# Patient Record
Sex: Female | Born: 1990 | Race: White | Hispanic: No | Marital: Married | Smoking: Never smoker
Health system: Southern US, Community
[De-identification: ages and names within clinical notes are randomized; demographics above are authoritative.]

## PROBLEM LIST (undated history)

## (undated) DIAGNOSIS — K219 Gastro-esophageal reflux disease without esophagitis: Secondary | ICD-10-CM

## (undated) DIAGNOSIS — O09299 Supervision of pregnancy with other poor reproductive or obstetric history, unspecified trimester: Secondary | ICD-10-CM

## (undated) DIAGNOSIS — I10 Essential (primary) hypertension: Secondary | ICD-10-CM

## (undated) DIAGNOSIS — E7800 Pure hypercholesterolemia, unspecified: Secondary | ICD-10-CM

## (undated) HISTORY — DX: Pure hypercholesterolemia, unspecified: E78.00

## (undated) HISTORY — DX: Essential (primary) hypertension: I10

## (undated) HISTORY — DX: Gastro-esophageal reflux disease without esophagitis: K21.9

## (undated) HISTORY — PX: NO PAST SURGERIES: SHX2092

---

## 2016-12-12 DIAGNOSIS — E66812 Obesity, class 2: Secondary | ICD-10-CM | POA: Insufficient documentation

## 2016-12-12 DIAGNOSIS — Z8349 Family history of other endocrine, nutritional and metabolic diseases: Secondary | ICD-10-CM | POA: Insufficient documentation

## 2016-12-12 DIAGNOSIS — E669 Obesity, unspecified: Secondary | ICD-10-CM | POA: Insufficient documentation

## 2017-03-06 DIAGNOSIS — E7800 Pure hypercholesterolemia, unspecified: Secondary | ICD-10-CM | POA: Insufficient documentation

## 2017-03-06 HISTORY — DX: Pure hypercholesterolemia, unspecified: E78.00

## 2017-03-31 DIAGNOSIS — Z8639 Personal history of other endocrine, nutritional and metabolic disease: Secondary | ICD-10-CM | POA: Insufficient documentation

## 2017-07-07 DIAGNOSIS — M79671 Pain in right foot: Secondary | ICD-10-CM | POA: Diagnosis not present

## 2017-07-17 DIAGNOSIS — Z130 Encounter for screening for diseases of the blood and blood-forming organs and certain disorders involving the immune mechanism: Secondary | ICD-10-CM | POA: Diagnosis not present

## 2017-07-17 DIAGNOSIS — Z124 Encounter for screening for malignant neoplasm of cervix: Secondary | ICD-10-CM | POA: Diagnosis not present

## 2017-07-17 DIAGNOSIS — Z01419 Encounter for gynecological examination (general) (routine) without abnormal findings: Secondary | ICD-10-CM | POA: Diagnosis not present

## 2017-07-17 DIAGNOSIS — E559 Vitamin D deficiency, unspecified: Secondary | ICD-10-CM | POA: Diagnosis not present

## 2017-07-17 DIAGNOSIS — Z6836 Body mass index (BMI) 36.0-36.9, adult: Secondary | ICD-10-CM | POA: Diagnosis not present

## 2017-07-17 LAB — VITAMIN D 25 HYDROXY (VIT D DEFICIENCY, FRACTURES): VIT D 25 HYDROXY: 21

## 2017-07-17 LAB — CBC AND DIFFERENTIAL
HEMATOCRIT: 39 (ref 36–46)
Hemoglobin: 13 (ref 12.0–16.0)
PLATELETS: 308 (ref 150–399)
WBC: 9.7

## 2017-07-17 LAB — HM PAP SMEAR

## 2017-08-10 DIAGNOSIS — S30861A Insect bite (nonvenomous) of abdominal wall, initial encounter: Secondary | ICD-10-CM | POA: Diagnosis not present

## 2017-09-04 ENCOUNTER — Other Ambulatory Visit: Payer: Self-pay

## 2017-09-04 ENCOUNTER — Ambulatory Visit: Payer: BLUE CROSS/BLUE SHIELD | Admitting: Physician Assistant

## 2017-09-04 ENCOUNTER — Encounter: Payer: Self-pay | Admitting: Physician Assistant

## 2017-09-04 VITALS — BP 120/84 | HR 92 | Temp 98.7°F | Ht 65.0 in | Wt 216.4 lb

## 2017-09-04 DIAGNOSIS — E7800 Pure hypercholesterolemia, unspecified: Secondary | ICD-10-CM

## 2017-09-04 DIAGNOSIS — E669 Obesity, unspecified: Secondary | ICD-10-CM

## 2017-09-04 DIAGNOSIS — Z7689 Persons encountering health services in other specified circumstances: Secondary | ICD-10-CM

## 2017-09-04 DIAGNOSIS — R635 Abnormal weight gain: Secondary | ICD-10-CM | POA: Diagnosis not present

## 2017-09-04 DIAGNOSIS — K219 Gastro-esophageal reflux disease without esophagitis: Secondary | ICD-10-CM | POA: Diagnosis not present

## 2017-09-04 LAB — CBC
HEMATOCRIT: 42.1 % (ref 36.0–46.0)
HEMOGLOBIN: 13.7 g/dL (ref 12.0–15.0)
MCHC: 32.6 g/dL (ref 30.0–36.0)
MCV: 86.4 fl (ref 78.0–100.0)
Platelets: 261 10*3/uL (ref 150.0–400.0)
RBC: 4.87 Mil/uL (ref 3.87–5.11)
RDW: 14.2 % (ref 11.5–15.5)
WBC: 7.1 10*3/uL (ref 4.0–10.5)

## 2017-09-04 LAB — COMPREHENSIVE METABOLIC PANEL
ALT: 19 U/L (ref 0–35)
AST: 18 U/L (ref 0–37)
Albumin: 3.9 g/dL (ref 3.5–5.2)
Alkaline Phosphatase: 47 U/L (ref 39–117)
BUN: 15 mg/dL (ref 6–23)
CHLORIDE: 103 meq/L (ref 96–112)
CO2: 30 mEq/L (ref 19–32)
Calcium: 9.5 mg/dL (ref 8.4–10.5)
Creatinine, Ser: 0.75 mg/dL (ref 0.40–1.20)
GFR: 99.05 mL/min (ref >60.00)
GLUCOSE: 93 mg/dL (ref 70–99)
POTASSIUM: 4.4 meq/L (ref 3.5–5.1)
SODIUM: 141 meq/L (ref 135–145)
Total Bilirubin: 0.4 mg/dL (ref 0.2–1.2)
Total Protein: 7.1 g/dL (ref 6.0–8.3)

## 2017-09-04 LAB — LIPID PANEL
CHOL/HDL RATIO: 3
Cholesterol: 297 mg/dL — ABNORMAL HIGH (ref 0–200)
HDL: 93.6 mg/dL (ref >39.00)
LDL Cholesterol: 187 mg/dL — ABNORMAL HIGH (ref 0–99)
NONHDL: 203.04
Triglycerides: 82 mg/dL (ref 0.0–149.0)
VLDL: 16.4 mg/dL (ref 0.0–40.0)

## 2017-09-04 LAB — TSH: TSH: 3.18 u[IU]/mL (ref 0.35–4.50)

## 2017-09-04 LAB — HEMOGLOBIN A1C: HEMOGLOBIN A1C: 5.3 % (ref 4.6–6.5)

## 2017-09-04 MED ORDER — RANITIDINE HCL 150 MG PO CAPS: 150.0000 mg | ORAL_CAPSULE | Freq: Two times a day (BID) | ORAL | 1 refills | Status: DC

## 2017-09-04 NOTE — Patient Instructions (Signed)
It was great to meet you!  We will contact you with your lab results.  Please schedule a nutrition appointment with me at your convenience.  Take Ranitidine (Zantac) 150 mg twice a day for the next month. Take on an empty stomach. After one month, decrease to one tablet daily.

## 2017-09-04 NOTE — Progress Notes (Signed)
Vanessa Keith is a 27 y.o. female here for a new problem.  History of Present Illness:   Chief Complaint  Patient presents with  . Establish Care    would like to discuss weight and heartburn.    HPI   Weight gain and obesity --  In high school was around 215 lb. Got down to 175 lb in college due to walking more. She has had steady weight gain for the past two years and is now around 215 lb. Started going to [employer] -- 3-5 x a week since January. Does feel like her muscle mass has increased, but weight is the same -- feels frustrated.  Endorses a bad relationship with food. Eats out sometimes. Partner also needs to lose weight and sometimes enables poor eating habits.  Per chart review: most recent HgbA1c was 5.2 in Sep 2018. Cholesterol was also elevated --> LDL was 172 at that time. TSH and free T4 normal. Her obgyn recently checked her Vit D and put her on 50k IU weekly. She does not know what her level was, we are requesting records.  She is currently on Balziva OCP. She feels like this is contributing to moodiness, cries very often. She is unsure if it is contributing to difficulty losing weight. She is thinking of trying to start a family in the next two years.  Heartburn -- has had issues with this in the past. Recently has had a flare, takes OTC acid reducers prn. Symptoms are preventing her from exercising. Denies chest pain, SOB. Tries to avoid trigger foods.   History reviewed. No pertinent past medical history.   Social History   Socioeconomic History  . Marital status: Unknown    Spouse name: Not on file  . Number of children: Not on file  . Years of education: Not on file  . Highest education level: Not on file  Occupational History  . Not on file  Social Needs  . Financial resource strain: Not on file  . Food insecurity:    Worry: Not on file    Inability: Not on file  . Transportation needs:    Medical: Not on file    Non-medical: Not on file  Tobacco  Use  . Smoking status: Never Smoker  . Smokeless tobacco: Never Used  Substance and Sexual Activity  . Alcohol use: Yes    Comment: socially "barely any"  . Drug use: Never  . Sexual activity: Not on file  Lifestyle  . Physical activity:    Days per week: Not on file    Minutes per session: Not on file  . Stress: Not on file  Relationships  . Social connections:    Talks on phone: Not on file    Gets together: Not on file    Attends religious service: Not on file    Active member of club or organization: Not on file    Attends meetings of clubs or organizations: Not on file    Relationship status: Not on file  . Intimate partner violence:    Fear of current or ex partner: Not on file    Emotionally abused: Not on file    Physically abused: Not on file    Forced sexual activity: Not on file  Other Topics Concern  . 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 No pertinent surgical history.  Family History  Problem Relation Age of Onset  . Thyroid disease Mother   . Thyroid disease Father   . Diabetes Maternal Uncle   . Breast cancer Neg Hx   . Colon cancer Neg Hx     Not on File  Current Medications:   Current Outpatient Medications:  .  norethindrone-ethinyl estradiol (OVCON-35,BALZIVA,BRIELLYN) 0.4-35 MG-MCG tablet, Take by mouth., Disp: , Rfl:  .  dicyclomine (BENTYL) 20 MG tablet, Take by mouth., Disp: , Rfl:  .  ranitidine (ZANTAC) 150 MG capsule, Take 1 capsule (150 mg total) by mouth 2 (two) times daily., Disp: 60 capsule, Rfl: 1   Review of Systems:   ROS  Negative unless otherwise specified per HPI.  Vitals:   Vitals:   09/04/17 0840  BP: 120/84  Pulse: 92  Temp: 98.7 F (37.1 C)  TempSrc: Oral  SpO2: 98%  Weight: 216 lb 6.4 oz (98.2 kg)  Height: 5\' 5"  (1.651 m)      Body mass index is 36.01 kg/m.  Physical Exam:   Physical Exam  Constitutional: She appears well-developed. She is cooperative.  Non-toxic appearance. She does not have a sickly appearance. She does not appear ill. No distress.  Cardiovascular: Normal rate, regular rhythm, S1 normal, S2 normal, normal heart sounds and normal pulses.  No LE edema  Pulmonary/Chest: Effort normal and breath sounds normal.  Abdominal: Normal appearance and bowel sounds are normal. There is no tenderness.  Neurological: She is alert. GCS eye subscore is 4. GCS verbal subscore is 5. GCS motor subscore is 6.  Skin: Skin is warm, dry and intact.  Psychiatric: She has a normal mood and affect. Her speech is normal and behavior is normal.  Nursing note and vitals reviewed.   Assessment and Plan:    Maralyn SagoSarah was seen today for establish care.  Diagnoses and all orders for this visit:  Encounter to establish care  Weight gain; Obesity, unspecified classification, unspecified obesity type, unspecified whether serious comorbidity present Will re-check labs as it has been almost 9 months since we have checked her labs. Recommended nutrition visit with me (bring husband if she can) to discuss her eating patterns. We briefly touched on My[employer]itness Pal. Provided nutrition packet homework for our next visit. Patient is agreeable to plan. -     Hemoglobin A1c -     Comprehensive metabolic panel -     CBC -     TSH  Elevated cholesterol LDL was quite high at last check -- she is fasting today, will re-check. -     Lipid panel  Gastroesophageal reflux disease, esophagitis presence not specified Uncontrolled. No red flags on exam. Start Zantac 150 mg BID. May taper down to 150 mg daily if able after 1 month. Follow-up if symptoms worsen or persist despite treatment. Avoid trigger foods and lying down soon after eating.  Other orders -     ranitidine (ZANTAC) 150 MG capsule; Take 1 capsule (150 mg total) by mouth 2  (two) times daily.    . Reviewed expectations re: course of current medical issues. . Discussed self-management of symptoms. . Outlined signs and symptoms indicating need for more acute intervention. . Patient verbalized understanding and all questions were answered. . See orders for this visit as documented in the electronic medical record. . Patient received an After-Visit Summary.   Jarold MottoSamantha Mahek Schlesinger, PA-C

## 2017-09-09 ENCOUNTER — Other Ambulatory Visit: Payer: Self-pay | Admitting: Physician Assistant

## 2017-09-09 ENCOUNTER — Encounter: Payer: Self-pay | Admitting: Physician Assistant

## 2017-09-09 MED ORDER — ATORVASTATIN CALCIUM 40 MG PO TABS: 40.0000 mg | ORAL_TABLET | Freq: Every day | ORAL | 1 refills | Status: DC

## 2017-09-11 ENCOUNTER — Encounter: Payer: Self-pay | Admitting: Physician Assistant

## 2017-09-30 ENCOUNTER — Encounter: Payer: Self-pay | Admitting: Physician Assistant

## 2017-10-02 ENCOUNTER — Ambulatory Visit: Payer: BLUE CROSS/BLUE SHIELD | Admitting: Physician Assistant

## 2017-10-02 ENCOUNTER — Other Ambulatory Visit: Payer: Self-pay | Admitting: *Deleted

## 2017-10-02 ENCOUNTER — Encounter: Payer: Self-pay | Admitting: Physician Assistant

## 2017-10-02 VITALS — BP 124/80 | HR 83 | Temp 98.4°F | Ht 65.0 in | Wt 211.0 lb

## 2017-10-02 DIAGNOSIS — M791 Myalgia, unspecified site: Secondary | ICD-10-CM | POA: Diagnosis not present

## 2017-10-02 DIAGNOSIS — E669 Obesity, unspecified: Secondary | ICD-10-CM | POA: Diagnosis not present

## 2017-10-02 DIAGNOSIS — E7800 Pure hypercholesterolemia, unspecified: Secondary | ICD-10-CM

## 2017-10-02 DIAGNOSIS — Z713 Dietary counseling and surveillance: Secondary | ICD-10-CM | POA: Diagnosis not present

## 2017-10-02 LAB — COMPREHENSIVE METABOLIC PANEL
ALT: 19 U/L (ref 0–35)
AST: 19 U/L (ref 0–37)
Albumin: 4.1 g/dL (ref 3.5–5.2)
Alkaline Phosphatase: 47 U/L (ref 39–117)
BILIRUBIN TOTAL: 0.6 mg/dL (ref 0.2–1.2)
BUN: 16 mg/dL (ref 6–23)
CO2: 30 meq/L (ref 19–32)
Calcium: 9.3 mg/dL (ref 8.4–10.5)
Chloride: 101 mEq/L (ref 96–112)
Creatinine, Ser: 0.77 mg/dL (ref 0.40–1.20)
GFR: 96.03 mL/min (ref >60.00)
GLUCOSE: 90 mg/dL (ref 70–99)
Potassium: 4.8 mEq/L (ref 3.5–5.1)
SODIUM: 139 meq/L (ref 135–145)
Total Protein: 7 g/dL (ref 6.0–8.3)

## 2017-10-02 LAB — CK: Total CK: 59 U/L (ref 7–177)

## 2017-10-02 NOTE — Patient Instructions (Addendum)
It was great to see you!  Stop your statin.  I recommend just doing a B-complex vitamin, research shows that niacin only increases good cholesterol, which yours is great!

## 2017-10-02 NOTE — Progress Notes (Signed)
Vanessa Keith is a 27 y.o. female here for Nutrition Consult  I acted as a Neurosurgeon for [employer], PA-C Corky Mull, LPN  History of Present Illness:   Chief Complaint  Patient presents with  . Nutrition Counseling    Pt is here to discuss Nutrition and Diet. Pt would like to discuss natural ways to lower cholesterol also. Pt would like to lose weight 40-45 pounds. Pt is exercising, does boot camp 4-5 times a week for 45 minutes.  Dietary recall: Breakfast 8:30am -- cup greek yogurt, 1/2 cup berries, 1 cup cherry tomatoes Lunch 12:15pm -- pouch of tuna fish, 1 cup fruit Dinner 6pm -- 6 oz protein, 1 slice Ezekial bread, veggie Snacks 2 x 12 oz protein shakes Beverages  Water, crystal light  Weight: Wt Readings from Last 3 Encounters:  10/02/17 211 lb (95.7 kg)  09/04/17 216 lb 6.4 oz (98.2 kg)    Exercise: [employer] several days a week  Support system: Husband and friends  Goals: 1- weight loss 2- lowering cholesterol naturally 3- introduce more nutritious foods into diet  Estimated daily energy needs: Calories: 1500 kcal Protein: 80 g Fluid: 2000 ml  She is also here to discuss her statin. Over the past week or so she has had myalgias after exercising. She is currently taking Lipitor 40 mg daily.  History reviewed. No pertinent past medical history.   Social History   Socioeconomic History  . Marital status: Married    Spouse name: Not on file  . Number of children: Not on file  . Years of education: Not on file  . Highest education level: Not on file  Occupational History  . Not on file  Social Needs  . Financial resource strain: Not on file  . Food insecurity:    Worry: Not on file    Inability: Not on file  . Transportation needs:    Medical: Not on file    Non-medical: Not on file  Tobacco Use  . Smoking status: Never Smoker  . Smokeless tobacco: Never Used  Substance and Sexual Activity  . Alcohol use: Yes    Comment: socially  "barely any"  . Drug use: Never  . Sexual activity: Not on file  Lifestyle  . Physical activity:    Days per week: Not on file    Minutes per session: Not on file  . Stress: Not on file  Relationships  . Social connections:    Talks on phone: Not on file    Gets together: Not on file    Attends religious service: Not on file    Active member of club or organization: Not on file    Attends meetings of clubs or organizations: Not on file    Relationship status: Not on file  . Intimate partner violence:    Fear of current or ex partner: Not on file    Emotionally abused: Not on file    Physically abused: Not on file    Forced sexual activity: Not on file  Other Topics Concern  . 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 No pertinent surgical history.  Family History  Problem Relation Age of Onset  . Thyroid disease Mother   . Thyroid disease Father   .  Diabetes Maternal Uncle   . Breast cancer Neg Hx   . Colon cancer Neg Hx     No Known Allergies  Current Medications:   Current Outpatient Medications:  .  atorvastatin (LIPITOR) 40 MG tablet, Take 1 tablet (40 mg total) by mouth daily., Disp: 90 tablet, Rfl: 1 .  norethindrone-ethinyl estradiol (OVCON-35,BALZIVA,BRIELLYN) 0.4-35 MG-MCG tablet, Take by mouth., Disp: , Rfl:  .  ranitidine (ZANTAC) 150 MG capsule, Take 1 capsule (150 mg total) by mouth 2 (two) times daily., Disp: 60 capsule, Rfl: 1   Review of Systems:   ROS  Negative unless otherwise specified per HPI.   Vitals:   Vitals:   10/02/17 0859  BP: 124/80  Pulse: 83  Temp: 98.4 F (36.9 C)  TempSrc: Oral  SpO2: 97%  Weight: 211 lb (95.7 kg)  Height: 5\' 5"  (1.651 m)     Body mass index is 35.11 kg/m.  Physical Exam:   Physical Exam  Constitutional: She is oriented  to person, place, and time. She appears well-developed and well-nourished.  HENT:  Head: Normocephalic and atraumatic.  Eyes: Conjunctivae and EOM are normal.  Neck: Normal range of motion. Neck supple.  Pulmonary/Chest: Effort normal.  Musculoskeletal: Normal range of motion.  Neurological: She is alert and oriented to person, place, and time.  Skin: Skin is warm and dry.  Psychiatric: She has a normal mood and affect. Her behavior is normal. Judgment and thought content normal.    Assessment and Plan:    Maralyn SagoSarah was seen today for nutrition counseling.  Diagnoses and all orders for this visit:  Myalgia Stop statin. Check CK. Discussed starting B complex. If CK normal, may consider lipitor a few days a week vs changing to lower potency statin. -     Comprehensive metabolic panel -     CK (Creatine Kinase)  Obesity, unspecified classification, unspecified obesity type, unspecified whether serious comorbidity present; Encounter for nutritional counseling; Elevated cholesterol Discussed diet and exercise with patient. Reviewed ways to increase fiber and decrease high saturated fats in diet. Work on increasing monounsaturated fats. Continue exercise. Discussed ways to balance out meals and snacks. Commended patient on efforts made thus far. Did discuss possible use of phentermine in future.   . Reviewed expectations re: course of current medical issues. . Discussed self-management of symptoms. . Outlined signs and symptoms indicating need for more acute intervention. . Patient verbalized understanding and all questions were answered. . See orders for this visit as documented in the electronic medical record. . Patient received an After-Visit Summary.  CMA or LPN served as scribe during this visit. History, Physical, and Plan performed by medical provider. Documentation and orders reviewed and attested to.  I spent 25 minutes with this patient, greater than 50% was face-to-face time  counseling regarding the above diagnoses.   Jarold MottoSamantha Worley, PA-C

## 2017-10-06 ENCOUNTER — Telehealth: Payer: Self-pay | Admitting: Physician Assistant

## 2017-10-06 NOTE — Telephone Encounter (Signed)
I called and spoke with the patient after speaking with Lea to review the account for charges not covered by insurance. After review it appears that all diagnosis and codes are correct and that the insurance just did not cover unfortunately. I explained this to the patient and she stated that she would reach out to her insurance.

## 2017-12-23 ENCOUNTER — Encounter: Payer: Self-pay | Admitting: Physician Assistant

## 2017-12-29 ENCOUNTER — Ambulatory Visit: Payer: BLUE CROSS/BLUE SHIELD | Admitting: Physician Assistant

## 2017-12-29 ENCOUNTER — Encounter: Payer: Self-pay | Admitting: Physician Assistant

## 2017-12-29 VITALS — BP 128/80 | HR 79 | Temp 98.5°F | Ht 65.0 in | Wt 206.5 lb

## 2017-12-29 DIAGNOSIS — E669 Obesity, unspecified: Secondary | ICD-10-CM

## 2017-12-29 DIAGNOSIS — E7800 Pure hypercholesterolemia, unspecified: Secondary | ICD-10-CM

## 2017-12-29 DIAGNOSIS — Z114 Encounter for screening for human immunodeficiency virus [HIV]: Secondary | ICD-10-CM

## 2017-12-29 LAB — LIPID PANEL
CHOL/HDL RATIO: 3
Cholesterol: 240 mg/dL — ABNORMAL HIGH (ref 0–200)
HDL: 78.3 mg/dL (ref >39.00)
LDL CALC: 144 mg/dL — AB (ref 0–99)
NonHDL: 161.77
TRIGLYCERIDES: 87 mg/dL (ref 0.0–149.0)
VLDL: 17.4 mg/dL (ref 0.0–40.0)

## 2017-12-29 MED ORDER — PHENTERMINE HCL 37.5 MG PO TABS: 37.5000 mg | ORAL_TABLET | Freq: Every day | ORAL | 2 refills | Status: DC

## 2017-12-29 NOTE — Patient Instructions (Signed)
It was great to see you!  Cut phentermine tablets in half. Start half tablet daily. Take half tablet daily x 2 weeks. If well tolerated, increase to full tablet.  If you develop any concerning symptoms -- palpitations, shortness of breath, chest pain, please notify us or go to the ER.  Let's follow-up in 3 months, sooner if you have concerns.  Take care,  Jarold Motto PA-C

## 2017-12-29 NOTE — Progress Notes (Signed)
Vanessa Keith is a 27 y.o. female is here to discuss: Medication for weight loss.  I acted as a Neurosurgeon for [employer], PA-C Corky Mull, LPN  History of Present Illness:   Chief Complaint  Patient presents with  . Discuss weight loss    HPI   Has been working well on diet and exercise. Down a total of 10 lb since June.  Is interested in starting medication. Has never been on any prescription medication in the past. Denies current issues with palpitations, chest pain, SOB, anxiety, insomnia.  Wt Readings from Last 5 Encounters:  12/29/17 206 lb 8 oz (93.7 kg)  10/02/17 211 lb (95.7 kg)  09/04/17 216 lb 6.4 oz (98.2 kg)    There are no preventive care reminders to display for this patient.  History reviewed. No pertinent past medical history.   Social History   Socioeconomic History  . Marital status: Married    Spouse name: Not on file  . Number of children: Not on file  . Years of education: Not on file  . Highest education level: Not on file  Occupational History  . Not on file  Social Needs  . Financial resource strain: Not on file  . Food insecurity:    Worry: Not on file    Inability: Not on file  . Transportation needs:    Medical: Not on file    Non-medical: Not on file  Tobacco Use  . Smoking status: Never Smoker  . Smokeless tobacco: Never Used  Substance and Sexual Activity  . Alcohol use: Yes    Comment: socially "barely any"  . Drug use: Never  . Sexual activity: Not on file  Lifestyle  . Physical activity:    Days per week: Not on file    Minutes per session: Not on file  . Stress: Not on file  Relationships  . Social connections:    Talks on phone: Not on file    Gets together: Not on file    Attends religious service: Not on file    Active member of club or organization: Not on file    Attends meetings of clubs or organizations: Not on file    Relationship status: Not on file  . Intimate partner violence:    Fear of  current or ex partner: Not on file    Emotionally abused: Not on file    Physically abused: Not on file    Forced sexual activity: Not on file  Other Topics Concern  . 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 No pertinent surgical history.  Family History  Problem Relation Age of Onset  . Thyroid disease Mother   . Thyroid disease Father   . Diabetes Maternal Uncle   . Hypercholesterolemia Maternal Grandmother   . Hypercholesterolemia Maternal Grandfather   . Breast cancer Neg Hx   . Colon cancer Neg Hx     PMHx, SurgHx, SocialHx, FamHx, Medications, and Allergies were reviewed in the Visit Navigator and updated as appropriate.   Patient Active Problem List   Diagnosis Date Noted  . Gastroesophageal reflux disease 09/04/2017  . History of vitamin D deficiency 03/31/2017  . Elevated cholesterol 03/06/2017  . Class 2 obesity without serious comorbidity in adult 12/12/2016  .  Family history of thyroid disorder 12/12/2016    Social History   Tobacco Use  . Smoking status: Never Smoker  . Smokeless tobacco: Never Used  Substance Use Topics  . Alcohol use: Yes    Comment: socially "barely any"  . Drug use: Never    Current Medications and Allergies:    Current Outpatient Medications:  .  norethindrone-ethinyl estradiol (OVCON-35,BALZIVA,BRIELLYN) 0.4-35 MG-MCG tablet, Take by mouth., Disp: , Rfl:  .  phentermine (ADIPEX-P) 37.5 MG tablet, Take 1 tablet (37.5 mg total) by mouth daily before breakfast., Disp: 30 tablet, Rfl: 2  No Known Allergies  Review of Systems   ROS  Negative unless otherwise specified per HPI.  Vitals:   Vitals:   12/29/17 0806  BP: 128/80  Pulse: 79  Temp: 98.5 F (36.9 C)  TempSrc: Oral  SpO2: 98%  Weight: 206 lb 8 oz (93.7 kg)  Height: 5\' 5"  (1.651 m)       Body mass index is 34.36 kg/m.   Physical Exam:    Physical Exam  Constitutional: She appears well-developed. She is cooperative.  Non-toxic appearance. She does not have a sickly appearance. She does not appear ill. No distress.  Cardiovascular: Normal rate, regular rhythm, S1 normal, S2 normal, normal heart sounds and normal pulses.  No LE edema  Pulmonary/Chest: Effort normal and breath sounds normal.  Neurological: She is alert. GCS eye subscore is 4. GCS verbal subscore is 5. GCS motor subscore is 6.  Skin: Skin is warm, dry and intact.  Psychiatric: She has a normal mood and affect. Her speech is normal and behavior is normal.  Nursing note and vitals reviewed.    Assessment and Plan:    Burnette was seen today for discuss weight loss.  Diagnoses and all orders for this visit:  Elevated cholesterol -     Lipid panel  Screening for HIV (human immunodeficiency virus) -     HIV Antibody (routine testing w rflx)  Obesity, unspecified classification, unspecified obesity type, unspecified whether serious comorbidity present Doing well with diet and exercise. She verbalized understanding of risks of starting phentermine. Will start at this time and follow-up in 3 months. Cut tablet in half and start daily. If tolerated after two weeks, may increase to full tablet daily. Follow-up if undesired symptoms arise.  Other orders -     phentermine (ADIPEX-P) 37.5 MG tablet; Take 1 tablet (37.5 mg total) by mouth daily before breakfast.    . Reviewed expectations re: course of current medical issues. . Discussed self-management of symptoms. . Outlined signs and symptoms indicating need for more acute intervention. . Patient verbalized understanding and all questions were answered. . See orders for this visit as documented in the electronic medical record. . Patient received an After Visit Summary.  CMA or LPN served as scribe during this visit. History, Physical, and Plan  performed by medical provider. The above documentation has been reviewed and is accurate and complete.   Jarold Motto, PA-C [place] [place] 12/29/2017  Follow-up: No follow-ups on file.

## 2017-12-30 LAB — HIV ANTIBODY (ROUTINE TESTING W REFLEX): HIV: NONREACTIVE

## 2018-03-12 ENCOUNTER — Ambulatory Visit (INDEPENDENT_AMBULATORY_CARE_PROVIDER_SITE_OTHER): Payer: BLUE CROSS/BLUE SHIELD | Admitting: Physician Assistant

## 2018-03-12 ENCOUNTER — Encounter: Payer: Self-pay | Admitting: Physician Assistant

## 2018-03-12 VITALS — BP 130/80 | HR 84 | Temp 98.4°F | Ht 65.0 in | Wt 198.0 lb

## 2018-03-12 DIAGNOSIS — E7800 Pure hypercholesterolemia, unspecified: Secondary | ICD-10-CM | POA: Diagnosis not present

## 2018-03-12 DIAGNOSIS — T466X5A Adverse effect of antihyperlipidemic and antiarteriosclerotic drugs, initial encounter: Secondary | ICD-10-CM | POA: Insufficient documentation

## 2018-03-12 DIAGNOSIS — E669 Obesity, unspecified: Secondary | ICD-10-CM | POA: Diagnosis not present

## 2018-03-12 DIAGNOSIS — M791 Myalgia, unspecified site: Secondary | ICD-10-CM

## 2018-03-12 DIAGNOSIS — Z0000 Encounter for general adult medical examination without abnormal findings: Secondary | ICD-10-CM

## 2018-03-12 LAB — CBC WITH DIFFERENTIAL/PLATELET
Basophils Absolute: 0.1 10*3/uL (ref 0.0–0.1)
Basophils Relative: 0.7 % (ref 0.0–3.0)
EOS PCT: 1 % (ref 0.0–5.0)
Eosinophils Absolute: 0.1 10*3/uL (ref 0.0–0.7)
HEMATOCRIT: 41.3 % (ref 36.0–46.0)
Hemoglobin: 13.8 g/dL (ref 12.0–15.0)
LYMPHS PCT: 34.5 % (ref 12.0–46.0)
Lymphs Abs: 2.6 10*3/uL (ref 0.7–4.0)
MCHC: 33.4 g/dL (ref 30.0–36.0)
MCV: 86.1 fl (ref 78.0–100.0)
Monocytes Absolute: 0.4 10*3/uL (ref 0.1–1.0)
Monocytes Relative: 5.2 % (ref 3.0–12.0)
Neutro Abs: 4.4 10*3/uL (ref 1.4–7.7)
Neutrophils Relative %: 58.6 % (ref 43.0–77.0)
Platelets: 287 10*3/uL (ref 150.0–400.0)
RBC: 4.8 Mil/uL (ref 3.87–5.11)
RDW: 13.8 % (ref 11.5–15.5)
WBC: 7.5 10*3/uL (ref 4.0–10.5)

## 2018-03-12 LAB — COMPREHENSIVE METABOLIC PANEL
ALT: 13 U/L (ref 0–35)
AST: 13 U/L (ref 0–37)
Albumin: 4 g/dL (ref 3.5–5.2)
Alkaline Phosphatase: 46 U/L (ref 39–117)
BUN: 12 mg/dL (ref 6–23)
CALCIUM: 9.3 mg/dL (ref 8.4–10.5)
CO2: 29 mEq/L (ref 19–32)
CREATININE: 0.74 mg/dL (ref 0.40–1.20)
Chloride: 103 mEq/L (ref 96–112)
GFR: 100.2 mL/min (ref >60.00)
Glucose, Bld: 82 mg/dL (ref 70–99)
POTASSIUM: 4.8 meq/L (ref 3.5–5.1)
Sodium: 138 mEq/L (ref 135–145)
Total Bilirubin: 0.5 mg/dL (ref 0.2–1.2)
Total Protein: 7 g/dL (ref 6.0–8.3)

## 2018-03-12 NOTE — Progress Notes (Signed)
I acted as a Neurosurgeon for [employer], PA-C Corky Mull, LPN Subjective:    Vanessa Keith is a 27 y.o. female and is here for a comprehensive physical exam.  HPI  There are no preventive care reminders to display for this patient.  Acute Concerns: None  Chronic Issues: Obesity -- continues on phentermine, takes up to twice a week. Sometimes it causes her to not eat enough prior to work outs. She is thinking to switching down to 1/2 tablet prn. She is down about 6 lb since I last saw her.  Wt Readings from Last 5 Encounters:  03/12/18 198 lb (89.8 kg)  12/29/17 206 lb 8 oz (93.7 kg)  10/02/17 211 lb (95.7 kg)  09/04/17 216 lb 6.4 oz (98.2 kg)   HLD -- continues to work on diet and exercise to control cholesterol levels. We trialed lipitor earlier this year but it caused myalgias.  Lab Results  Component Value Date   CHOL 240 (H) 12/29/2017   HDL 78.30 12/29/2017   LDLCALC 144 (H) 12/29/2017   TRIG 87.0 12/29/2017   CHOLHDL 3 12/29/2017    Health Maintenance: Immunizations -- Pt will get Flu shot later with husband and let us know. Colonoscopy -- N/A Mammogram -- N/A PAP -- UTD, NILM done 07/17/2017 Bone Density -- N/A Diet -- variable  Sleep habits -- good sleep Exercise -- goes to [employer] about 2-3 times a week Current Weight -- Weight: 198 lb (89.8 kg)  Weight History: Wt Readings from Last 10 Encounters:  03/12/18 198 lb (89.8 kg)  12/29/17 206 lb 8 oz (93.7 kg)  10/02/17 211 lb (95.7 kg)  09/04/17 216 lb 6.4 oz (98.2 kg)  Mood -- sometimes grumpy, denies si/hi Patient's last menstrual period was 03/11/2018. Birth control/Period characteristics -- currently on OCPs, plan to go off in April (approx)  Depression screen [facility] 2/9 09/04/2017  Decreased Interest 0  Down, Depressed, Hopeless 0  PHQ - 2 Score 0   Other providers/specialists: Patient Care Team: Vanessa Motto, [place] as PCP - General (Physician Assistant)     PMHx, SurgHx,  SocialHx, Medications, and Allergies were reviewed in the Visit Navigator and updated as appropriate.   Past Medical History:  Diagnosis Date  . Elevated cholesterol 03/06/2017    History reviewed. No pertinent surgical history.   Family History  Problem Relation Age of Onset  . Thyroid disease Mother   . Thyroid disease Father   . Diabetes Maternal Uncle   . Hypercholesterolemia Maternal Grandmother   . Hypercholesterolemia Maternal Grandfather   . Breast cancer Neg Hx   . Colon cancer Neg Hx     Social History   Tobacco Use  . Smoking status: Never Smoker  . Smokeless tobacco: Never Used  Substance Use Topics  . Alcohol use: Yes    Comment: socially "barely any"  . Drug use: Never    Review of Systems:   Review of Systems  Constitutional: Negative for chills, fever, malaise/fatigue and weight loss.  HENT: Negative for hearing loss, sinus pain and sore throat.   Respiratory: Negative for cough and hemoptysis.   Cardiovascular: Negative for chest pain, palpitations, leg swelling and PND.  Gastrointestinal: Negative for abdominal pain, constipation, diarrhea, heartburn, nausea and vomiting.  Genitourinary: Negative for dysuria, frequency and urgency.  Musculoskeletal: Negative for back pain, myalgias and neck pain.  Skin: Negative for itching and rash.  Neurological: Negative for dizziness, tingling, seizures and headaches.  Endo/Heme/Allergies: Negative for polydipsia.  Psychiatric/Behavioral: Negative  for depression. The patient is not nervous/anxious.      Objective:   BP 130/80 (BP Location: Left Arm, Patient Position: Sitting, Cuff Size: Large)   Pulse 84   Temp 98.4 F (36.9 C) (Oral)   Ht 5\' 5"  (1.651 m)   Wt 198 lb (89.8 kg)   LMP 03/11/2018   SpO2 98%   BMI 32.95 kg/m   General Appearance:    Alert, cooperative, no distress, appears stated age  Head:    Normocephalic, without obvious abnormality, atraumatic  Eyes:    PERRL, conjunctiva/corneas  clear, EOM's intact, fundi    benign, both eyes  Ears:    Normal TM's and external ear canals, both ears  Nose:   Nares normal, septum midline, mucosa normal, no drainage    or sinus tenderness  Throat:   Lips, mucosa, and tongue normal; teeth and gums normal  Neck:   Supple, symmetrical, trachea midline, no adenopathy;    thyroid:  no enlargement/tenderness/nodules; no carotid   bruit or JVD  Back:     Symmetric, no curvature, ROM normal, no CVA tenderness  Lungs:     Clear to auscultation bilaterally, respirations unlabored  Chest Wall:    No tenderness or deformity   Heart:    Regular rate and rhythm, S1 and S2 normal, no murmur, rub   or gallop  Breast Exam:    Deferred  Abdomen:     Soft, non-tender, bowel sounds active all four quadrants,    no masses, no organomegaly  Genitalia:    Deferred  Rectal:    Deferred  Extremities:   Extremities normal, atraumatic, no cyanosis or edema  Pulses:   2+ and symmetric all extremities  Skin:   Skin color, texture, turgor normal, no rashes or lesions  Lymph nodes:   Cervical, supraclavicular, and axillary nodes normal  Neurologic:   CNII-XII intact, normal strength, sensation and reflexes    throughout    Assessment/Plan:   Vanessa Keith was seen today for annual exam.  Diagnoses and all orders for this visit:  Routine physical examination Today patient counseled on age appropriate routine health concerns for screening and prevention, each reviewed and up to date or declined. Immunizations reviewed and up to date or declined. Labs ordered and reviewed. Risk factors for depression reviewed and negative. Hearing function and visual acuity are intact. ADLs screened and addressed as needed. Functional ability and level of safety reviewed and appropriate. Education, counseling and referrals performed based on assessed risks today. Patient provided with a copy of personalized plan for preventive services.  Obesity, unspecified classification,  unspecified obesity type, unspecified whether serious comorbidity present Continues on phentermine prn. Recommend taking 1/2 tablet when needed. Follow-up in 3 months. -     CBC with Differential/Platelet -     Comprehensive metabolic panel  Elevated cholesterol Too soon for re-check. Continue diet and exercise.   Well Adult Exam: Labs ordered: Yes. Patient counseling was done. See below for items discussed. Discussed the patient's BMI. The BMI is not in the acceptable range; BMI management plan is completed Follow up in 3 months.  Patient Counseling:   [x]     Nutrition: Stressed importance of moderation in sodium/caffeine intake, saturated fat and cholesterol, caloric balance, sufficient intake of fresh fruits, vegetables, fiber, calcium, iron, and 1 mg of folate supplement per day (for females capable of pregnancy).   [x]      Stressed the importance of regular exercise.    [x]     Substance Abuse:  Discussed cessation/primary prevention of tobacco, alcohol, or other drug use; driving or other dangerous activities under the influence; availability of treatment for abuse.    [x]      Injury prevention: Discussed safety belts, safety helmets, smoke detector, smoking near bedding or upholstery.    [x]      Sexuality: Discussed sexually transmitted diseases, partner selection, use of condoms, avoidance of unintended pregnancy  and contraceptive alternatives.    [x]     Dental health: Discussed importance of regular tooth brushing, flossing, and dental visits.   [x]      Health maintenance and immunizations reviewed. Please refer to Health maintenance section.   CMA or LPN served as scribe during this visit. History, Physical, and Plan performed by medical provider. The above documentation has been reviewed and is accurate and complete.  Vanessa MottoSamantha Annelie Boak, PA-C [place] [facility]

## 2018-03-12 NOTE — Patient Instructions (Signed)
It was great to see you!  Please go to the lab for blood work.   Our office will call you with your results unless you have chosen to receive results via MyChart.  If your blood work is normal we will follow-up each year for physicals and as scheduled for chronic medical problems.  If anything is abnormal we will treat accordingly and get you in for a follow-up.  Take care,  [facility] Maintenance, Female Adopting a healthy lifestyle and getting preventive care can go a long way to promote health and wellness. Talk with your health care provider about what schedule of regular examinations is right for you. This is a good chance for you to check in with your provider about disease prevention and staying healthy. In between checkups, there are plenty of things you can do on your own. Experts have done a lot of research about which lifestyle changes and preventive measures are most likely to keep you healthy. Ask your health care provider for more information. Weight and diet Eat a healthy diet  Be sure to include plenty of vegetables, fruits, low-fat dairy products, and lean protein.  Do not eat a lot of foods high in solid fats, added sugars, or salt.  Get regular exercise. This is one of the most important things you can do for your health. ? Most adults should exercise for at least 150 minutes each week. The exercise should increase your heart rate and make you sweat (moderate-intensity exercise). ? Most adults should also do strengthening exercises at least twice a week. This is in addition to the moderate-intensity exercise. Maintain a healthy weight  Body mass index (BMI) is a measurement that can be used to identify possible weight problems. It estimates body fat based on height and weight. Your health care provider can help determine your BMI and help you achieve or maintain a healthy weight.  For females 55 years of age and older: ? A BMI below 18.5 is considered  underweight. ? A BMI of 18.5 to 24.9 is normal. ? A BMI of 25 to 29.9 is considered overweight. ? A BMI of 30 and above is considered obese. Watch levels of cholesterol and blood lipids  You should start having your blood tested for lipids and cholesterol at 26 years of age, then have this test every 5 years.  You may need to have your cholesterol levels checked more often if: ? Your lipid or cholesterol levels are high. ? You are older than 26 years of age. ? You are at high risk for heart disease. Cancer screening Lung Cancer  Lung cancer screening is recommended for adults 60-73 years old who are at high risk for lung cancer because of a history of smoking.  A yearly low-dose CT scan of the lungs is recommended for people who: ? Currently smoke. ? Have quit within the past 15 years. ? Have at least a 30-pack-year history of smoking. A pack year is smoking an average of one pack of cigarettes a day for 1 year.  Yearly screening should continue until it has been 15 years since you quit.  Yearly screening should stop if you develop a health problem that would prevent you from having lung cancer treatment. Breast Cancer  Practice breast self-awareness. This means understanding how your breasts normally appear and feel.  It also means doing regular breast self-exams. Let your health care provider know about any changes, no matter how small.  If you are in  your 20s or 30s, you should have a clinical breast exam (CBE) by a health care provider every 1-3 years as part of a regular health exam.  If you are 40 or older, have a CBE every year. Also consider having a breast X-ray (mammogram) every year.  If you have a family history of breast cancer, talk to your health care provider about genetic screening.  If you are at high risk for breast cancer, talk to your health care provider about having an MRI and a mammogram every year.  Breast cancer gene (BRCA) assessment is recommended  for women who have family members with BRCA-related cancers. BRCA-related cancers include: ? Breast. ? Ovarian. ? Tubal. ? Peritoneal cancers.  Results of the assessment will determine the need for genetic counseling and BRCA1 and BRCA2 testing. Cervical Cancer Your health care provider may recommend that you be screened regularly for cancer of the pelvic organs (ovaries, uterus, and vagina). This screening involves a pelvic examination, including checking for microscopic changes to the surface of your cervix (Pap test). You may be encouraged to have this screening done every 3 years, beginning at age 21.  For women ages 30-65, health care providers may recommend pelvic exams and Pap testing every 3 years, or they may recommend the Pap and pelvic exam, combined with testing for human papilloma virus (HPV), every 5 years. Some types of HPV increase your risk of cervical cancer. Testing for HPV may also be done on women of any age with unclear Pap test results.  Other health care providers may not recommend any screening for nonpregnant women who are considered low risk for pelvic cancer and who do not have symptoms. Ask your health care provider if a screening pelvic exam is right for you.  If you have had past treatment for cervical cancer or a condition that could lead to cancer, you need Pap tests and screening for cancer for at least 20 years after your treatment. If Pap tests have been discontinued, your risk factors (such as having a new sexual partner) need to be reassessed to determine if screening should resume. Some women have medical problems that increase the chance of getting cervical cancer. In these cases, your health care provider may recommend more frequent screening and Pap tests. Colorectal Cancer  This type of cancer can be detected and often prevented.  Routine colorectal cancer screening usually begins at 26 years of age and continues through 26 years of age.  Your health  care provider may recommend screening at an earlier age if you have risk factors for colon cancer.  Your health care provider may also recommend using home test kits to check for hidden blood in the stool.  A small camera at the end of a tube can be used to examine your colon directly (sigmoidoscopy or colonoscopy). This is done to check for the earliest forms of colorectal cancer.  Routine screening usually begins at age 50.  Direct examination of the colon should be repeated every 5-10 years through 26 years of age. However, you may need to be screened more often if early forms of precancerous polyps or small growths are found. Skin Cancer  Check your skin from head to toe regularly.  Tell your health care provider about any new moles or changes in moles, especially if there is a change in a mole's shape or color.  Also tell your health care provider if you have a mole that is larger than the size of a pencil   eraser.  Always use sunscreen. Apply sunscreen liberally and repeatedly throughout the day.  Protect yourself by wearing long sleeves, pants, a wide-brimmed hat, and sunglasses whenever you are outside. Heart disease, diabetes, and high blood pressure  High blood pressure causes heart disease and increases the risk of stroke. High blood pressure is more likely to develop in: ? People who have blood pressure in the high end of the normal range (130-139/85-89 mm Hg). ? People who are overweight or obese. ? People who are African American.  If you are 68-37 years of age, have your blood pressure checked every 3-5 years. If you are 48 years of age or older, have your blood pressure checked every year. You should have your blood pressure measured twice-once when you are at a hospital or clinic, and once when you are not at a hospital or clinic. Record the average of the two measurements. To check your blood pressure when you are not at a hospital or clinic, you can use: ? An automated  blood pressure machine at a pharmacy. ? A home blood pressure monitor.  If you are between 92 years and 57 years old, ask your health care provider if you should take aspirin to prevent strokes.  Have regular diabetes screenings. This involves taking a blood sample to check your fasting blood sugar level. ? If you are at a normal weight and have a low risk for diabetes, have this test once every three years after 26 years of age. ? If you are overweight and have a high risk for diabetes, consider being tested at a younger age or more often. Preventing infection Hepatitis B  If you have a higher risk for hepatitis B, you should be screened for this virus. You are considered at high risk for hepatitis B if: ? You were born in a country where hepatitis B is common. Ask your health care provider which countries are considered high risk. ? Your parents were born in a high-risk country, and you have not been immunized against hepatitis B (hepatitis B vaccine). ? You have HIV or AIDS. ? You use needles to inject street drugs. ? You live with someone who has hepatitis B. ? You have had sex with someone who has hepatitis B. ? You get hemodialysis treatment. ? You take certain medicines for conditions, including cancer, organ transplantation, and autoimmune conditions. Hepatitis C  Blood testing is recommended for: ? Everyone born from [birth year] through 1964. ? Anyone with known risk factors for hepatitis C. Sexually transmitted infections (STIs)  You should be screened for sexually transmitted infections (STIs) including gonorrhea and chlamydia if: ? You are sexually active and are younger than 26 years of age. ? You are older than 26 years of age and your health care provider tells you that you are at risk for this type of infection. ? Your sexual activity has changed since you were last screened and you are at an increased risk for chlamydia or gonorrhea. Ask your health care provider if you are at  risk.  If you do not have HIV, but are at risk, it may be recommended that you take a prescription medicine daily to prevent HIV infection. This is called pre-exposure prophylaxis (PrEP). You are considered at risk if: ? You are sexually active and do not regularly use condoms or know the HIV status of your partner(s). ? You take drugs by injection. ? You are sexually active with a partner who has HIV. Talk with your health  care provider about whether you are at high risk of being infected with HIV. If you choose to begin PrEP, you should first be tested for HIV. You should then be tested every 3 months for as long as you are taking PrEP. Pregnancy  If you are premenopausal and you may become pregnant, ask your health care provider about preconception counseling.  If you may become pregnant, take 400 to 800 micrograms (mcg) of folic acid every day.  If you want to prevent pregnancy, talk to your health care provider about birth control (contraception). Osteoporosis and menopause  Osteoporosis is a disease in which the bones lose minerals and strength with aging. This can result in serious bone fractures. Your risk for osteoporosis can be identified using a bone density scan.  If you are 11 years of age or older, or if you are at risk for osteoporosis and fractures, ask your health care provider if you should be screened.  Ask your health care provider whether you should take a calcium or vitamin D supplement to lower your risk for osteoporosis.  Menopause may have certain physical symptoms and risks.  Hormone replacement therapy may reduce some of these symptoms and risks. Talk to your health care provider about whether hormone replacement therapy is right for you. Follow these instructions at home:  Schedule regular health, dental, and eye exams.  Stay current with your immunizations.  Do not use any tobacco products including cigarettes, chewing tobacco, or electronic  cigarettes.  If you are pregnant, do not drink alcohol.  If you are breastfeeding, limit how much and how often you drink alcohol.  Limit alcohol intake to no more than 1 drink per day for nonpregnant women. One drink equals 12 ounces of beer, 5 ounces of wine, or 1 ounces of hard liquor.  Do not use street drugs.  Do not share needles.  Ask your health care provider for help if you need support or information about quitting drugs.  Tell your health care provider if you often feel depressed.  Tell your health care provider if you have ever been abused or do not feel safe at home. This information is not intended to replace advice given to you by your health care provider. Make sure you discuss any questions you have with your health care provider. Document Released: 09/23/2010 Document Revised: 08/16/2015 Document Reviewed: 12/12/2014 Elsevier Interactive Patient Education  2019 [employer].

## 2018-07-05 ENCOUNTER — Telehealth: Payer: Self-pay | Admitting: *Deleted

## 2018-07-05 NOTE — Telephone Encounter (Signed)
Left message on voicemail to call office. Needs to schedule follow up for weight loss and medication with Samantha.

## 2018-07-07 NOTE — Telephone Encounter (Signed)
Pt called back scheduled followup.

## 2018-07-07 NOTE — Telephone Encounter (Signed)
Left message on voicemail to call office.  

## 2018-07-08 ENCOUNTER — Encounter: Payer: Self-pay | Admitting: Physician Assistant

## 2018-07-08 ENCOUNTER — Ambulatory Visit (INDEPENDENT_AMBULATORY_CARE_PROVIDER_SITE_OTHER): Payer: BLUE CROSS/BLUE SHIELD | Admitting: Physician Assistant

## 2018-07-08 DIAGNOSIS — E669 Obesity, unspecified: Secondary | ICD-10-CM | POA: Diagnosis not present

## 2018-07-08 NOTE — Progress Notes (Signed)
Virtual Visit via Video   I connected with Vanessa Keith on 07/08/18 at 10:00 AM EDT by a video enabled telemedicine application and verified that I am speaking with the correct person using two identifiers. Location patient: Home Location provider: [facility], Office Persons participating in the virtual visit: Ricci Shong, Jarold Motto, PA , Jarold Motto PA-C   I discussed the limitations of evaluation and management by telemedicine and the availability of in person appointments. The patient expressed understanding and agreed to proceed.  Subjective:   HPI: Weight loss Pt following up today, she is presently taking 1/2 dose Phentermine 37.5 mg off and on for appetite suppressant. This week she is really working on increasing her activity, and has been active every day this week. She is currently in week 5 of working from home. She recently weighed herself and is around 200 lb. Denies chest pain or SOB, palpitations with this medication.  Wt Readings from Last 3 Encounters:  03/12/18 198 lb (89.8 kg)  12/29/17 206 lb 8 oz (93.7 kg)  10/02/17 211 lb (95.7 kg)    ROS: See pertinent positives and negatives per HPI.  Patient Active Problem List   Diagnosis Date Noted  . Myalgia due to statin 03/12/2018  . Gastroesophageal reflux disease 09/04/2017  . History of vitamin D deficiency 03/31/2017  . Elevated cholesterol 03/06/2017  . Class 2 obesity without serious comorbidity in adult 12/12/2016  . Family history of thyroid disorder 12/12/2016    Social History   Tobacco Use  . Smoking status: Never Smoker  . Smokeless tobacco: Never Used  Substance Use Topics  . Alcohol use: Yes    Comment: socially "barely any"    Current Outpatient Medications:  .  norethindrone-ethinyl estradiol (OVCON-35,BALZIVA,BRIELLYN) 0.4-35 MG-MCG tablet, Take by mouth., Disp: , Rfl:  .  phentermine (ADIPEX-P) 37.5 MG tablet, Take 1 tablet (37.5 mg total) by mouth daily before  breakfast., Disp: 30 tablet, Rfl: 2  No Known Allergies  Objective:   VITALS: Per patient if applicable, see vitals. GENERAL: Alert, appears well and in no acute distress. HEENT: Atraumatic, conjunctiva clear, no obvious abnormalities on inspection of external nose and ears. NECK: Normal movements of the head and neck. CARDIOPULMONARY: No increased WOB. Speaking in clear sentences. I:E ratio WNL.  MS: Moves all visible extremities without noticeable abnormality. PSYCH: Pleasant and cooperative, well-groomed. Speech normal rate and rhythm. Affect is appropriate. Insight and judgement are appropriate. Attention is focused, linear, and appropriate.  NEURO: CN grossly intact. Oriented as arrived to appointment on time with no prompting. Moves both UE equally.  SKIN: No obvious lesions, wounds, erythema, or cyanosis noted on face or hands.  Assessment and Plan:   Vanessa Keith was seen today for weight loss.  Diagnoses and all orders for this visit:  Class 2 obesity without serious comorbidity in adult, unspecified BMI, unspecified obesity type   Overall doing well. Encouraged continued physical activity. She tells me that she does not need a phentermine refill right now -- but will let Vanessa Keith know if she will soon. Follow-up in 3 months, sooner if concerns.   . Reviewed expectations re: course of current medical issues. . Discussed self-management of symptoms. . Outlined signs and symptoms indicating need for more acute intervention. . Patient verbalized understanding and all questions were answered. Marland Kitchen Health Maintenance issues including appropriate healthy diet, exercise, and smoking avoidance were discussed with patient. . See orders for this visit as documented in the electronic medical record.  I discussed  the assessment and treatment plan with the patient. The patient was provided an opportunity to ask questions and all were answered. The patient agreed with the plan and demonstrated an  understanding of the instructions.   The patient was advised to call back or seek an in-person evaluation if the symptoms worsen or if the condition fails to improve as anticipated.   Fountain InnSamantha Deanna Boehlke, GeorgiaPA 07/08/2018

## 2018-07-28 ENCOUNTER — Encounter: Payer: Self-pay | Admitting: Physician Assistant

## 2018-08-11 ENCOUNTER — Telehealth: Payer: BLUE CROSS/BLUE SHIELD | Admitting: Family

## 2018-08-11 DIAGNOSIS — Z6832 Body mass index (BMI) 32.0-32.9, adult: Secondary | ICD-10-CM | POA: Diagnosis not present

## 2018-08-11 DIAGNOSIS — J069 Acute upper respiratory infection, unspecified: Secondary | ICD-10-CM | POA: Diagnosis not present

## 2018-08-11 DIAGNOSIS — Z3161 Procreative counseling and advice using natural family planning: Secondary | ICD-10-CM | POA: Diagnosis not present

## 2018-08-11 DIAGNOSIS — Z01419 Encounter for gynecological examination (general) (routine) without abnormal findings: Secondary | ICD-10-CM | POA: Diagnosis not present

## 2018-08-11 DIAGNOSIS — Z118 Encounter for screening for other infectious and parasitic diseases: Secondary | ICD-10-CM | POA: Diagnosis not present

## 2018-08-11 MED ORDER — FLUTICASONE PROPIONATE 50 MCG/ACT NA SUSP: 2.0000 | Freq: Every day | NASAL | 6 refills | Status: DC

## 2018-08-11 NOTE — Progress Notes (Signed)
We are sorry you are not feeling well.  Here is how we plan to help!  Approximately 5 minutes was spent documenting and reviewing patient's chart.    Based on what you have shared with me, it looks like you may have a viral upper respiratory infection.  Upper respiratory infections are caused by a large number of viruses; however, rhinovirus is the most common cause.   Symptoms vary from person to person, with common symptoms including sore throat, cough, and fatigue or lack of energy.  A low-grade fever of up to 100.4 may present, but is often uncommon.  Symptoms vary however, and are closely related to a person's age or underlying illnesses.  The most common symptoms associated with an upper respiratory infection are nasal discharge or congestion, cough, sneezing, headache and pressure in the ears and face.  These symptoms usually persist for about 3 to 10 days, but can last up to 2 weeks.  It is important to know that upper respiratory infections do not cause serious illness or complications in most cases.    Upper respiratory infections can be transmitted from person to person, with the most common method of transmission being a person's hands.  The virus is able to live on the skin and can infect other persons for up to 2 hours after direct contact.  Also, these can be transmitted when someone coughs or sneezes; thus, it is important to cover the mouth to reduce this risk.  To keep the spread of the illness at bay, good hand hygiene is very important.  This is an infection that is most likely caused by a virus. There are no specific treatments other than to help you with the symptoms until the infection runs its course.  We are sorry you are not feeling well.  Here is how we plan to help!   For nasal congestion, you may use an oral decongestants such as Mucinex D or if you have glaucoma or high blood pressure use plain Mucinex.  Saline nasal spray or nasal drops can help and can safely be used as  often as needed for congestion.  For your congestion, I have prescribed Fluticasone nasal spray one spray in each nostril twice a day  If you do not have a history of heart disease, hypertension, diabetes or thyroid disease, prostate/bladder issues or glaucoma, you may also use Sudafed to treat nasal congestion.  It is highly recommended that you consult with a pharmacist or your primary care physician to ensure this medication is safe for you to take.     If you have a cough, you may use cough suppressants such as Delsym and Robitussin.  If you have glaucoma or high blood pressure, you can also use Coricidin HBP.     If you have a sore or scratchy throat, use a saltwater gargle-  to  teaspoon of salt dissolved in a 4-ounce to 8-ounce glass of warm water.  Gargle the solution for approximately 15-30 seconds and then spit.  It is important not to swallow the solution.  You can also use throat lozenges/cough drops and Chloraseptic spray to help with throat pain or discomfort.  Warm or cold liquids can also be helpful in relieving throat pain.  For headache, pain or general discomfort, you can use Ibuprofen or Tylenol as directed.   Some authorities believe that zinc sprays or the use of Echinacea may shorten the course of your symptoms.   HOME CARE . Only take medications as instructed   by your medical team. . Be sure to drink plenty of fluids. Water is fine as well as fruit juices, sodas and electrolyte beverages. You may want to stay away from caffeine or alcohol. If you are nauseated, try taking small sips of liquids. How do you know if you are getting enough fluid? Your urine should be a pale yellow or almost colorless. . Get rest. . Taking a steamy shower or using a humidifier may help nasal congestion and ease sore throat pain. You can place a towel over your head and breathe in the steam from hot water coming from a faucet. . Using a saline nasal spray works much the same way. . Cough drops,  hard candies and sore throat lozenges may ease your cough. . Avoid close contacts especially the very young and the elderly . Cover your mouth if you cough or sneeze . Always remember to wash your hands.   GET HELP RIGHT AWAY IF: . You develop worsening fever. . If your symptoms do not improve within 10 days . You develop yellow or green discharge from your nose over 3 days. . You have coughing fits . You develop a severe head ache or visual changes. . You develop shortness of breath, difficulty breathing or start having chest pain . Your symptoms persist after you have completed your treatment plan  MAKE SURE YOU   Understand these instructions.  Will watch your condition.  Will get help right away if you are not doing well or get worse.  Your e-visit answers were reviewed by a board certified advanced clinical practitioner to complete your personal care plan. Depending upon the condition, your plan could have included both over the counter or prescription medications. Please review your pharmacy choice. If there is a problem, you may call our nursing hot line at and have the prescription routed to another pharmacy. Your safety is important to us. If you have drug allergies check your prescription carefully.   You can use MyChart to ask questions about today's visit, request a non-urgent call back, or ask for a work or school excuse for 24 hours related to this e-Visit. If it has been greater than 24 hours you will need to follow up with your provider, or enter a new e-Visit to address those concerns. You will get an e-mail in the next two days asking about your experience.  I hope that your e-visit has been valuable and will speed your recovery. Thank you for using e-visits.       

## 2018-10-31 ENCOUNTER — Encounter: Payer: Self-pay | Admitting: Physician Assistant

## 2018-11-01 ENCOUNTER — Other Ambulatory Visit: Payer: Self-pay | Admitting: Physician Assistant

## 2018-11-01 DIAGNOSIS — Z20822 Contact with and (suspected) exposure to covid-19: Secondary | ICD-10-CM

## 2018-11-01 DIAGNOSIS — Z20828 Contact with and (suspected) exposure to other viral communicable diseases: Secondary | ICD-10-CM

## 2018-11-23 ENCOUNTER — Encounter: Payer: Self-pay | Admitting: Physician Assistant

## 2018-11-24 NOTE — Progress Notes (Signed)
Vanessa DuttonSarah Keith is a 28 y.o. female here for a new problem.  I acted as a Neurosurgeonscribe for Energy East CorporationSamantha Bolivar Koranda, PA-C Vanessa Mullonna Orphanos, LPN  History of Present Illness:   Chief Complaint  Patient presents with  . Discoloration on back of neck    HPI   Discoloration  Pt c/o discoloration on back on neck. Noticed it last weekend Sunday, she was rubbing her neck and felt like it had a different texture. She states that she has a history of acanthosis nigracans when she was in high school but it went away. She hasn't made any medication changes other than discontinuing her OCPs as she is trying to get pregnant. Patient's last menstrual period was 10/24/2018. She states that her periods are historically irregular when not on OCPs. Denies known dx of PCOS. Most recent labs for patient were almost 1 year ago. We have been watching her LDL, as it was 187 about two years ago. Last HgbA1c of 5.3 was over a year ago. Weight has been relatively stable. Denies dietary changes. She also endorses recent fatigue, not significant, but states that it is worth noting. Is actively trying to get pregnant, period is 2-3 days late. Has not taken a pregnancy test.  Wt Readings from Last 3 Encounters:  11/25/18 204 lb (92.5 kg)  03/12/18 198 lb (89.8 kg)  12/29/17 206 lb 8 oz (93.7 kg)     Past Medical History:  Diagnosis Date  . Elevated cholesterol 03/06/2017     Social History   Socioeconomic History  . Marital status: Married    Spouse name: Not on file  . Number of children: Not on file  . Years of education: Not on file  . Highest education level: Not on file  Occupational History  . Not on file  Social Needs  . Financial resource strain: Not on file  . Food insecurity    Worry: Not on file    Inability: Not on file  . Transportation needs    Medical: Not on file    Non-medical: Not on file  Tobacco Use  . Smoking status: Never Smoker  . Smokeless tobacco: Never Used  Substance and Sexual Activity   . Alcohol use: Yes    Comment: socially "barely any"  . Drug use: Never  . Sexual activity: Not on file  Lifestyle  . Physical activity    Days per week: Not on file    Minutes per session: Not on file  . Stress: Not on file  Relationships  . Social Musicianconnections    Talks on phone: Not on file    Gets together: Not on file    Attends religious service: Not on file    Active member of club or organization: Not on file    Attends meetings of clubs or organizations: Not on file    Relationship status: Not on file  . Intimate partner violence    Fear of current or ex partner: Not on file    Emotionally abused: Not on file    Physically abused: Not on file    Forced sexual activity: Not on file  Other Topics Concern  . 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 No pertinent surgical history.  Family History  Problem  Relation Age of Onset  . Thyroid disease Mother   . Thyroid disease Father   . Diabetes Maternal Uncle   . Hypercholesterolemia Maternal Grandmother   . Hypercholesterolemia Maternal Grandfather   . Breast cancer Neg Hx   . Colon cancer Neg Hx     No Known Allergies  Current Medications:   Current Outpatient Medications:  .  Prenatal MV-Min-Fe Fum-FA-DHA (PRENATAL 1 PO), Take 1 tablet by mouth daily., Disp: , Rfl:  .  phentermine (ADIPEX-P) 37.5 MG tablet, Take 1 tablet (37.5 mg total) by mouth daily before breakfast. (Patient not taking: Reported on 11/25/2018), Disp: 30 tablet, Rfl: 2   Review of Systems:   ROS Negative unless otherwise specified per HPI.  Vitals:   Vitals:   11/25/18 0940  BP: 118/78  Pulse: 81  Temp: 98.3 F (36.8 C)  TempSrc: Temporal  SpO2: 99%  Weight: 204 lb (92.5 kg)  Height: 5\' 5"  (1.651 m)     Body mass index is 33.95 kg/m.  Physical Exam:    Physical Exam Vitals signs and nursing note reviewed.  Constitutional:      General: She is not in acute distress.    Appearance: She is well-developed. She is not ill-appearing or toxic-appearing.  Cardiovascular:     Rate and Rhythm: Normal rate and regular rhythm.     Pulses: Normal pulses.     Heart sounds: Normal heart sounds, S1 normal and S2 normal.     Comments: No LE edema Pulmonary:     Effort: Pulmonary effort is normal.     Breath sounds: Normal breath sounds.  Skin:    General: Skin is warm and dry.     Comments: Slightly darkened skin with velvety texture at nape at posterior neck  Neurological:     Mental Status: She is alert.     GCS: GCS eye subscore is 4. GCS verbal subscore is 5. GCS motor subscore is 6.  Psychiatric:        Speech: Speech normal.        Behavior: Behavior normal. Behavior is cooperative.    Results for orders placed or performed in visit on 11/25/18  POCT urine pregnancy  Result Value Ref Range   Preg Test, Ur Negative Negative     Assessment and Plan:   Jazilyn was seen today for discoloration on back of neck.  Diagnoses and all orders for this visit:  AN (acanthosis nigricans); Irregular periods Will update labs to assess for insulin resistance, update her lipid panel, and assess for TSH abnormality. Further intervention based on lab results. Urine pregnancy is negative. If all tests are normal, will have her see her gyn for possible PCOS work-up. Patient verbalized understanding and is in agreement to plan. -     POCT urine pregnancy -     CBC with Differential/Platelet -     Hemoglobin A1c -     Lipid panel -     TSH -     Comprehensive metabolic panel  . Reviewed expectations re: course of current medical issues. . Discussed self-management of symptoms. . Outlined signs and symptoms indicating need for more acute intervention. . Patient verbalized understanding and all questions were answered. . See orders for this visit as  documented in the electronic medical record. . Patient received an After-Visit Summary.  CMA or LPN served as scribe during this visit. History, Physical, and Plan performed by medical provider. The above documentation has been reviewed and is accurate and complete.  Inda Coke, PA-C

## 2018-11-25 ENCOUNTER — Other Ambulatory Visit: Payer: Self-pay

## 2018-11-25 ENCOUNTER — Ambulatory Visit: Payer: BC Managed Care – PPO | Admitting: Physician Assistant

## 2018-11-25 ENCOUNTER — Encounter: Payer: Self-pay | Admitting: Physician Assistant

## 2018-11-25 VITALS — BP 118/78 | HR 81 | Temp 98.3°F | Ht 65.0 in | Wt 204.0 lb

## 2018-11-25 DIAGNOSIS — N926 Irregular menstruation, unspecified: Secondary | ICD-10-CM

## 2018-11-25 DIAGNOSIS — L83 Acanthosis nigricans: Secondary | ICD-10-CM

## 2018-11-25 LAB — COMPREHENSIVE METABOLIC PANEL
ALT: 33 U/L (ref 0–35)
AST: 33 U/L (ref 0–37)
Albumin: 3.9 g/dL (ref 3.5–5.2)
Alkaline Phosphatase: 52 U/L (ref 39–117)
BUN: 12 mg/dL (ref 6–23)
CO2: 28 mEq/L (ref 19–32)
Calcium: 9.1 mg/dL (ref 8.4–10.5)
Chloride: 102 mEq/L (ref 96–112)
Creatinine, Ser: 0.8 mg/dL (ref 0.40–1.20)
GFR: 85.71 mL/min (ref >60.00)
Glucose, Bld: 86 mg/dL (ref 70–99)
Potassium: 4 mEq/L (ref 3.5–5.1)
Sodium: 138 mEq/L (ref 135–145)
Total Bilirubin: 0.5 mg/dL (ref 0.2–1.2)
Total Protein: 6.7 g/dL (ref 6.0–8.3)

## 2018-11-25 LAB — CBC WITH DIFFERENTIAL/PLATELET
Basophils Absolute: 0.1 10*3/uL (ref 0.0–0.1)
Basophils Relative: 0.7 % (ref 0.0–3.0)
Eosinophils Absolute: 0.2 10*3/uL (ref 0.0–0.7)
Eosinophils Relative: 2.8 % (ref 0.0–5.0)
HCT: 39.4 % (ref 36.0–46.0)
Hemoglobin: 12.9 g/dL (ref 12.0–15.0)
Lymphocytes Relative: 38 % (ref 12.0–46.0)
Lymphs Abs: 2.9 10*3/uL (ref 0.7–4.0)
MCHC: 32.7 g/dL (ref 30.0–36.0)
MCV: 85.5 fl (ref 78.0–100.0)
Monocytes Absolute: 0.6 10*3/uL (ref 0.1–1.0)
Monocytes Relative: 7.3 % (ref 3.0–12.0)
Neutro Abs: 3.9 10*3/uL (ref 1.4–7.7)
Neutrophils Relative %: 51.2 % (ref 43.0–77.0)
Platelets: 262 10*3/uL (ref 150.0–400.0)
RBC: 4.61 Mil/uL (ref 3.87–5.11)
RDW: 13.4 % (ref 11.5–15.5)
WBC: 7.7 10*3/uL (ref 4.0–10.5)

## 2018-11-25 LAB — LIPID PANEL
Cholesterol: 205 mg/dL — ABNORMAL HIGH (ref 0–200)
HDL: 68.6 mg/dL (ref >39.00)
LDL Cholesterol: 127 mg/dL — ABNORMAL HIGH (ref 0–99)
NonHDL: 136.77
Total CHOL/HDL Ratio: 3
Triglycerides: 51 mg/dL (ref 0.0–149.0)
VLDL: 10.2 mg/dL (ref 0.0–40.0)

## 2018-11-25 LAB — POCT URINE PREGNANCY: Preg Test, Ur: NEGATIVE

## 2018-11-25 LAB — TSH: TSH: 3.61 u[IU]/mL (ref 0.35–4.50)

## 2018-11-25 LAB — HEMOGLOBIN A1C: Hgb A1c MFr Bld: 5.3 % (ref 4.6–6.5)

## 2018-11-25 NOTE — Patient Instructions (Signed)
It was great to see you!  I will be in touch with your lab results and if we have further recommendations.  If everything looks normal, we could consider having you follow-up with your ob-gyn to assess for PCOS.  Have a great trip to [place] and be safe :)  Take care,  Inda Coke PA-C

## 2018-12-08 DIAGNOSIS — Z3169 Encounter for other general counseling and advice on procreation: Secondary | ICD-10-CM | POA: Diagnosis not present

## 2019-04-07 DIAGNOSIS — Z20828 Contact with and (suspected) exposure to other viral communicable diseases: Secondary | ICD-10-CM | POA: Diagnosis not present

## 2019-04-28 DIAGNOSIS — Z3200 Encounter for pregnancy test, result unknown: Secondary | ICD-10-CM | POA: Diagnosis not present

## 2019-04-28 DIAGNOSIS — Z3689 Encounter for other specified antenatal screening: Secondary | ICD-10-CM | POA: Diagnosis not present

## 2019-05-25 DIAGNOSIS — Z3201 Encounter for pregnancy test, result positive: Secondary | ICD-10-CM | POA: Diagnosis not present

## 2019-06-09 DIAGNOSIS — Z118 Encounter for screening for other infectious and parasitic diseases: Secondary | ICD-10-CM | POA: Diagnosis not present

## 2019-06-09 DIAGNOSIS — Z3682 Encounter for antenatal screening for nuchal translucency: Secondary | ICD-10-CM | POA: Diagnosis not present

## 2019-06-09 DIAGNOSIS — Z3A10 10 weeks gestation of pregnancy: Secondary | ICD-10-CM | POA: Diagnosis not present

## 2019-06-09 DIAGNOSIS — O99211 Obesity complicating pregnancy, first trimester: Secondary | ICD-10-CM | POA: Diagnosis not present

## 2019-06-09 DIAGNOSIS — Z3689 Encounter for other specified antenatal screening: Secondary | ICD-10-CM | POA: Diagnosis not present

## 2019-06-09 LAB — OB RESULTS CONSOLE HIV ANTIBODY (ROUTINE TESTING): HIV: NONREACTIVE

## 2019-06-09 LAB — OB RESULTS CONSOLE RUBELLA ANTIBODY, IGM: Rubella: IMMUNE

## 2019-06-09 LAB — OB RESULTS CONSOLE RPR: RPR: NONREACTIVE

## 2019-06-09 LAB — OB RESULTS CONSOLE HEPATITIS B SURFACE ANTIGEN: Hepatitis B Surface Ag: NEGATIVE

## 2019-06-23 DIAGNOSIS — O99211 Obesity complicating pregnancy, first trimester: Secondary | ICD-10-CM | POA: Diagnosis not present

## 2019-06-23 DIAGNOSIS — Z3A12 12 weeks gestation of pregnancy: Secondary | ICD-10-CM | POA: Diagnosis not present

## 2019-06-23 DIAGNOSIS — Z3682 Encounter for antenatal screening for nuchal translucency: Secondary | ICD-10-CM | POA: Diagnosis not present

## 2019-07-09 DIAGNOSIS — Z03818 Encounter for observation for suspected exposure to other biological agents ruled out: Secondary | ICD-10-CM | POA: Diagnosis not present

## 2019-07-09 DIAGNOSIS — Z20828 Contact with and (suspected) exposure to other viral communicable diseases: Secondary | ICD-10-CM | POA: Diagnosis not present

## 2019-07-20 DIAGNOSIS — Z3A16 16 weeks gestation of pregnancy: Secondary | ICD-10-CM | POA: Diagnosis not present

## 2019-07-20 DIAGNOSIS — O99212 Obesity complicating pregnancy, second trimester: Secondary | ICD-10-CM | POA: Diagnosis not present

## 2019-07-20 DIAGNOSIS — Z361 Encounter for antenatal screening for raised alphafetoprotein level: Secondary | ICD-10-CM | POA: Diagnosis not present

## 2019-08-16 DIAGNOSIS — Z3A19 19 weeks gestation of pregnancy: Secondary | ICD-10-CM | POA: Diagnosis not present

## 2019-08-16 DIAGNOSIS — O358XX0 Maternal care for other (suspected) fetal abnormality and damage, not applicable or unspecified: Secondary | ICD-10-CM | POA: Diagnosis not present

## 2019-09-13 DIAGNOSIS — O358XX0 Maternal care for other (suspected) fetal abnormality and damage, not applicable or unspecified: Secondary | ICD-10-CM | POA: Diagnosis not present

## 2019-09-13 DIAGNOSIS — Z3A23 23 weeks gestation of pregnancy: Secondary | ICD-10-CM | POA: Diagnosis not present

## 2019-10-12 DIAGNOSIS — O358XX0 Maternal care for other (suspected) fetal abnormality and damage, not applicable or unspecified: Secondary | ICD-10-CM | POA: Diagnosis not present

## 2019-10-12 DIAGNOSIS — Z3689 Encounter for other specified antenatal screening: Secondary | ICD-10-CM | POA: Diagnosis not present

## 2019-10-12 DIAGNOSIS — Z3A28 28 weeks gestation of pregnancy: Secondary | ICD-10-CM | POA: Diagnosis not present

## 2019-10-12 LAB — OB RESULTS CONSOLE RPR: RPR: NONREACTIVE

## 2019-10-27 DIAGNOSIS — Z23 Encounter for immunization: Secondary | ICD-10-CM | POA: Diagnosis not present

## 2019-10-27 DIAGNOSIS — Z3A30 30 weeks gestation of pregnancy: Secondary | ICD-10-CM | POA: Diagnosis not present

## 2019-10-27 DIAGNOSIS — O358XX0 Maternal care for other (suspected) fetal abnormality and damage, not applicable or unspecified: Secondary | ICD-10-CM | POA: Diagnosis not present

## 2019-11-10 DIAGNOSIS — O99213 Obesity complicating pregnancy, third trimester: Secondary | ICD-10-CM | POA: Diagnosis not present

## 2019-11-10 DIAGNOSIS — Z3A32 32 weeks gestation of pregnancy: Secondary | ICD-10-CM | POA: Diagnosis not present

## 2019-11-21 DIAGNOSIS — Z3A33 33 weeks gestation of pregnancy: Secondary | ICD-10-CM | POA: Diagnosis not present

## 2019-11-21 DIAGNOSIS — O99213 Obesity complicating pregnancy, third trimester: Secondary | ICD-10-CM | POA: Diagnosis not present

## 2019-12-09 DIAGNOSIS — O99213 Obesity complicating pregnancy, third trimester: Secondary | ICD-10-CM | POA: Diagnosis not present

## 2019-12-09 DIAGNOSIS — Z3685 Encounter for antenatal screening for Streptococcus B: Secondary | ICD-10-CM | POA: Diagnosis not present

## 2019-12-09 DIAGNOSIS — Z3A36 36 weeks gestation of pregnancy: Secondary | ICD-10-CM | POA: Diagnosis not present

## 2019-12-09 LAB — OB RESULTS CONSOLE GBS: GBS: NEGATIVE

## 2019-12-15 DIAGNOSIS — O99213 Obesity complicating pregnancy, third trimester: Secondary | ICD-10-CM | POA: Diagnosis not present

## 2019-12-15 DIAGNOSIS — Z3A37 37 weeks gestation of pregnancy: Secondary | ICD-10-CM | POA: Diagnosis not present

## 2019-12-20 DIAGNOSIS — O99213 Obesity complicating pregnancy, third trimester: Secondary | ICD-10-CM | POA: Diagnosis not present

## 2019-12-20 DIAGNOSIS — O26893 Other specified pregnancy related conditions, third trimester: Secondary | ICD-10-CM | POA: Diagnosis not present

## 2019-12-20 DIAGNOSIS — R609 Edema, unspecified: Secondary | ICD-10-CM | POA: Diagnosis not present

## 2019-12-20 DIAGNOSIS — Z3A37 37 weeks gestation of pregnancy: Secondary | ICD-10-CM | POA: Diagnosis not present

## 2019-12-26 DIAGNOSIS — Z3A38 38 weeks gestation of pregnancy: Secondary | ICD-10-CM | POA: Diagnosis not present

## 2019-12-26 DIAGNOSIS — O99213 Obesity complicating pregnancy, third trimester: Secondary | ICD-10-CM | POA: Diagnosis not present

## 2020-01-04 DIAGNOSIS — O99213 Obesity complicating pregnancy, third trimester: Secondary | ICD-10-CM | POA: Diagnosis not present

## 2020-01-04 DIAGNOSIS — Z3A40 40 weeks gestation of pregnancy: Secondary | ICD-10-CM | POA: Diagnosis not present

## 2020-01-06 ENCOUNTER — Other Ambulatory Visit: Payer: Self-pay | Admitting: Certified Nurse Midwife

## 2020-01-06 ENCOUNTER — Telehealth (HOSPITAL_COMMUNITY): Payer: Self-pay | Admitting: *Deleted

## 2020-01-06 ENCOUNTER — Encounter (HOSPITAL_COMMUNITY): Payer: Self-pay | Admitting: *Deleted

## 2020-01-06 DIAGNOSIS — Z3A40 40 weeks gestation of pregnancy: Secondary | ICD-10-CM | POA: Diagnosis not present

## 2020-01-06 DIAGNOSIS — O99213 Obesity complicating pregnancy, third trimester: Secondary | ICD-10-CM | POA: Diagnosis not present

## 2020-01-06 NOTE — Telephone Encounter (Signed)
Preadmission screen  

## 2020-01-10 ENCOUNTER — Other Ambulatory Visit (HOSPITAL_COMMUNITY)
Admission: RE | Admit: 2020-01-10 | Discharge: 2020-01-10 | Disposition: A | Discharge status: Home / Self Care | Payer: BC Managed Care – PPO | Source: Ambulatory Visit | Attending: Obstetrics and Gynecology | Admitting: Obstetrics and Gynecology

## 2020-01-10 DIAGNOSIS — Z01812 Encounter for preprocedural laboratory examination: Secondary | ICD-10-CM | POA: Insufficient documentation

## 2020-01-10 DIAGNOSIS — Z20822 Contact with and (suspected) exposure to covid-19: Secondary | ICD-10-CM | POA: Insufficient documentation

## 2020-01-10 LAB — SARS CORONAVIRUS 2 (TAT 6-24 HRS): SARS Coronavirus 2: NEGATIVE

## 2020-01-11 ENCOUNTER — Other Ambulatory Visit: Payer: Self-pay

## 2020-01-11 ENCOUNTER — Inpatient Hospital Stay (HOSPITAL_COMMUNITY)
Admission: AD | Admit: 2020-01-11 | Discharge: 2020-01-15 | DRG: 787 | Disposition: A | Discharge status: Home / Self Care | Payer: BC Managed Care – PPO | Attending: Obstetrics & Gynecology | Admitting: Obstetrics & Gynecology

## 2020-01-11 ENCOUNTER — Encounter (HOSPITAL_COMMUNITY): Payer: Self-pay | Admitting: Certified Nurse Midwife

## 2020-01-11 DIAGNOSIS — Z20822 Contact with and (suspected) exposure to covid-19: Secondary | ICD-10-CM | POA: Diagnosis not present

## 2020-01-11 DIAGNOSIS — O99213 Obesity complicating pregnancy, third trimester: Secondary | ICD-10-CM | POA: Diagnosis not present

## 2020-01-11 DIAGNOSIS — Z98891 History of uterine scar from previous surgery: Secondary | ICD-10-CM

## 2020-01-11 DIAGNOSIS — Z412 Encounter for routine and ritual male circumcision: Secondary | ICD-10-CM | POA: Diagnosis not present

## 2020-01-11 DIAGNOSIS — D62 Acute posthemorrhagic anemia: Secondary | ICD-10-CM | POA: Diagnosis not present

## 2020-01-11 DIAGNOSIS — O99214 Obesity complicating childbirth: Secondary | ICD-10-CM | POA: Diagnosis present

## 2020-01-11 DIAGNOSIS — Z8759 Personal history of other complications of pregnancy, childbirth and the puerperium: Secondary | ICD-10-CM

## 2020-01-11 DIAGNOSIS — O480 Post-term pregnancy: Secondary | ICD-10-CM | POA: Diagnosis not present

## 2020-01-11 DIAGNOSIS — O9081 Anemia of the puerperium: Secondary | ICD-10-CM | POA: Diagnosis not present

## 2020-01-11 DIAGNOSIS — O99892 Other specified diseases and conditions complicating childbirth: Secondary | ICD-10-CM | POA: Diagnosis present

## 2020-01-11 DIAGNOSIS — Z23 Encounter for immunization: Secondary | ICD-10-CM | POA: Diagnosis not present

## 2020-01-11 DIAGNOSIS — Z3A41 41 weeks gestation of pregnancy: Secondary | ICD-10-CM

## 2020-01-11 DIAGNOSIS — Z3A00 Weeks of gestation of pregnancy not specified: Secondary | ICD-10-CM | POA: Diagnosis not present

## 2020-01-11 DIAGNOSIS — Z3490 Encounter for supervision of normal pregnancy, unspecified, unspecified trimester: Secondary | ICD-10-CM | POA: Diagnosis present

## 2020-01-11 LAB — CBC
HCT: 41 % (ref 36.0–46.0)
Hemoglobin: 13.4 g/dL (ref 12.0–15.0)
MCH: 28.7 pg (ref 26.0–34.0)
MCHC: 32.7 g/dL (ref 30.0–36.0)
MCV: 87.8 fL (ref 80.0–100.0)
Platelets: 222 10*3/uL (ref 150–400)
RBC: 4.67 MIL/uL (ref 3.87–5.11)
RDW: 13.8 % (ref 11.5–15.5)
WBC: 11.6 10*3/uL — ABNORMAL HIGH (ref 4.0–10.5)
nRBC: 0 % (ref 0.0–0.2)

## 2020-01-11 LAB — TYPE AND SCREEN
ABO/RH(D): O POS
Antibody Screen: NEGATIVE

## 2020-01-11 MED ORDER — MISOPROSTOL 50MCG HALF TABLET
50.0000 ug | ORAL_TABLET | ORAL | Status: DC | PRN
  Administered 2020-01-11 (×2): 50 ug via BUCCAL
  Filled 2020-01-11 (×2): qty 1

## 2020-01-11 MED ORDER — LIDOCAINE HCL (PF) 1 % IJ SOLN: 30.0000 mL | INTRAMUSCULAR | Status: DC | PRN

## 2020-01-11 MED ORDER — FENTANYL CITRATE (PF) 100 MCG/2ML IJ SOLN: 50.0000 ug | INTRAMUSCULAR | Status: DC | PRN

## 2020-01-11 MED ORDER — TERBUTALINE SULFATE 1 MG/ML IJ SOLN: 0.2500 mg | Freq: Once | INTRAMUSCULAR | Status: DC | PRN

## 2020-01-11 MED ORDER — SOD CITRATE-CITRIC ACID 500-334 MG/5ML PO SOLN
30.0000 mL | ORAL | Status: DC | PRN
  Filled 2020-01-11: qty 15

## 2020-01-11 MED ORDER — OXYTOCIN 10 UNIT/ML IJ SOLN: 10.0000 [IU] | Freq: Once | INTRAMUSCULAR | Status: DC

## 2020-01-11 MED ORDER — ONDANSETRON HCL 4 MG/2ML IJ SOLN: 4.0000 mg | Freq: Four times a day (QID) | INTRAMUSCULAR | Status: DC | PRN

## 2020-01-11 MED ORDER — ACETAMINOPHEN 500 MG PO TABS: 1000.0000 mg | ORAL_TABLET | Freq: Four times a day (QID) | ORAL | Status: DC | PRN

## 2020-01-11 NOTE — Progress Notes (Signed)
S: Feeling some cramps but tolerable and able to rest. Husband at the bedside.   O: Vitals:   01/11/20 1731 01/11/20 1845 01/11/20 1939 01/11/20 2122  BP: 118/80 109/63 127/82 (!) 141/81  Pulse: 95 90 98 87  Resp:  16 16 16   Temp:    98.3 F (36.8 C)  TempSrc:    Oral  Weight:      Height:       FHT:  FHR: 125 bpm, variability: moderate,  accelerations:  Present,  decelerations:  Absent UC:   irregular, every 3.5-7 minutes SVE:   Dilation: 1 Effacement (%): 50 Station: -2 Exam by:: [email or web address], CNM  A / P: Induction of labor due to postterm and BPP 6/8  Fetal Wellbeing:  Category I Pain Control:  Labor support without medications Anticipated MOD:  NSVD   Will give another dose of buccal Cytotec and will consider foley balloon at next SVE.   8/8, CNM, MSN 01/11/2020, 9:30 PM

## 2020-01-11 NOTE — H&P (Signed)
OB ADMISSION/ HISTORY & PHYSICAL:  Admission Date: 01/11/2020  3:24 PM  Admit Diagnosis: Encounter for induction of labor [Z34.90]    Vanessa Keith is a 29 y.o. female presenting for IOL for postdates and BPP 6/8. Patient was seen today in the office for post dates fetal well being testing. Ultrasound revealed low normal AFI with a BPP 6/8, 2 off for breathing. Patient was sent to labor and delivery for direct admission. Denies contractions, leaking of fluid, or vaginal bleeding. Endorses + fetal movement. Husband, Joe, at the bedside providing support. Mervyn Gay, doula, will be present for labor and birth. Expecting baby boy "Cheree Ditto".   Prenatal History: G1P0   EDC : 01/04/2020, by Other Basis  [facility] 10 weeks  Prenatal course complicated by: 1. Obesity, BMI 32 2. Dependent edema  Prenatal Labs: ABO, Rh:   O POS Antibody: NEG (10/20 1600) Negative Rubella:   Immune RPR:   Non-reactive HBsAg:   Negative HIV:   Negative GBS:   Negative 1 hr Glucola : 114 Genetic Screening: AFP WNL, nuchal translucency WNL Ultrasound: normal XY anatomy, posterior placenta, EFW 36 weeks 6lb 12oz, 70%. BPP today 6/8, AFI 7.1cm  TDAP: UTD COVID-19: UTD FLU: at discharge    Maternal Diabetes: No Genetic Screening: Normal Maternal Ultrasounds/Referrals: Normal Fetal Ultrasounds or other Referrals:  None Maternal Substance Abuse:  No Significant Maternal Medications:  None Significant Maternal Lab Results:  Group B Strep negative Other Comments:  None  Medical / Surgical History : Past medical history:  Past Medical History:  Diagnosis Date  . Elevated cholesterol 03/06/2017    Past surgical history: History reviewed. No pertinent surgical history.   Family History:  Family History  Problem Relation Age of Onset  . Thyroid disease Mother   . Thyroid disease Father   . Diabetes Maternal Uncle   . Hypercholesterolemia Maternal Grandmother   .  Hypercholesterolemia Maternal Grandfather   . Breast cancer Neg Hx   . Colon cancer Neg Hx     Social History:  reports that she has never smoked. She has never used smokeless tobacco. She reports current alcohol use. She reports that she does not use drugs.  Allergies: Patient has no known allergies.   Current Medications at time of admission:  Medications Prior to Admission  Medication Sig Dispense Refill Last Dose  . lansoprazole (PREVACID) 15 MG capsule Take 15 mg by mouth daily at 12 noon.   01/10/2020 at Unknown time  . Prenatal MV-Min-Fe Fum-FA-DHA (PRENATAL 1 PO) Take 1 tablet by mouth daily.   01/10/2020 at Unknown time  . phentermine (ADIPEX-P) 37.5 MG tablet Take 1 tablet (37.5 mg total) by mouth daily before breakfast. (Patient not taking: Reported on 11/25/2018) 30 tablet 2     Review of Systems: Review of Systems  All other systems reviewed and are negative.  Physical Exam: Vital signs and nursing notes reviewed.  Patient Vitals for the past 24 hrs:  BP Temp Temp src Pulse Height Weight  01/11/20 1731 118/80 -- -- 95 -- --  01/11/20 1700 134/73 -- -- (!) 103 -- --  01/11/20 1622 -- -- -- -- 5\' 5"  (1.651 m) 114.9 kg  01/11/20 1547 (!) 147/82 98.3 F (36.8 C) Oral (!) 114 -- --    General: AAO x 3, NAD Heart: RRR Lungs:CTAB Abdomen: Gravid, NT, Leopold's 8-8.5lbs Extremities: 1+ non-pitting edema Genitalia / VE: Dilation: Closed Presentation: Vertex Exam by:: [email or web address], CNM   FHR: 135BPM, mod variability, +  accels, no decels TOCO: Contractions q 5-7, mild on palpation  Labs:   Pending T&S, CBC, RPR  Recent Labs    01/11/20 1600  WBC 11.6*  HGB 13.4  HCT 41.0  PLT 222   Assessment:  29 y.o. G1P0 at [redacted]w[redacted]d  1. IOL for postdates and BPP 6/8, 2 off for tone 2. FHR category 1 3. GBS negative 4. Desires waterbirth but open to epidural 5. Plans to breastfeed 6. Obesity, BMI 32  Plan:  1. Admit to BS 2. Routine L&D orders 3. Analgesia/anesthesia  PRN  4. Cytotec buccally q 4 hours for cervical ripening. Will consider foley balloon when 1-2cm. 5. Anticipate NSVB  Dr. Billy Coast notified of admission / plan of care  June Leap CNM, MSN 01/11/2020, 5:50 PM

## 2020-01-12 ENCOUNTER — Inpatient Hospital Stay (HOSPITAL_COMMUNITY): Payer: BC Managed Care – PPO | Admitting: Anesthesiology

## 2020-01-12 ENCOUNTER — Encounter (HOSPITAL_COMMUNITY)
Admission: AD | Disposition: A | Discharge status: Home / Self Care | Payer: Self-pay | Source: Home / Self Care | Attending: Obstetrics & Gynecology

## 2020-01-12 ENCOUNTER — Inpatient Hospital Stay (HOSPITAL_COMMUNITY): Payer: BC Managed Care – PPO

## 2020-01-12 ENCOUNTER — Encounter (HOSPITAL_COMMUNITY): Payer: Self-pay | Admitting: Certified Nurse Midwife

## 2020-01-12 ENCOUNTER — Inpatient Hospital Stay (HOSPITAL_COMMUNITY)
Admission: AD | Admit: 2020-01-12 | Payer: BC Managed Care – PPO | Source: Home / Self Care | Admitting: Obstetrics and Gynecology

## 2020-01-12 DIAGNOSIS — Z8759 Personal history of other complications of pregnancy, childbirth and the puerperium: Secondary | ICD-10-CM

## 2020-01-12 LAB — RPR: RPR Ser Ql: NONREACTIVE

## 2020-01-12 SURGERY — Surgical Case
Anesthesia: Epidural | Wound class: Clean Contaminated

## 2020-01-12 MED ORDER — OXYTOCIN-SODIUM CHLORIDE 30-0.9 UT/500ML-% IV SOLN: 2.5000 [IU]/h | INTRAVENOUS | Status: DC

## 2020-01-12 MED ORDER — SODIUM CHLORIDE (PF) 0.9 % IJ SOLN
INTRAMUSCULAR | Status: DC | PRN
  Administered 2020-01-12: 12 mL/h via EPIDURAL

## 2020-01-12 MED ORDER — ONDANSETRON HCL 4 MG/2ML IJ SOLN
INTRAMUSCULAR | Status: DC | PRN
  Administered 2020-01-12: 4 mg via INTRAVENOUS

## 2020-01-12 MED ORDER — TERBUTALINE SULFATE 1 MG/ML IJ SOLN: 0.2500 mg | Freq: Once | INTRAMUSCULAR | Status: DC | PRN

## 2020-01-12 MED ORDER — FLEET ENEMA 7-19 GM/118ML RE ENEM: 1.0000 | ENEMA | RECTAL | Status: DC | PRN

## 2020-01-12 MED ORDER — FENTANYL-BUPIVACAINE-NACL 0.5-0.125-0.9 MG/250ML-% EP SOLN
EPIDURAL | Status: AC
  Filled 2020-01-12: qty 250

## 2020-01-12 MED ORDER — DIPHENHYDRAMINE HCL 25 MG PO CAPS: 25.0000 mg | ORAL_CAPSULE | ORAL | Status: DC | PRN

## 2020-01-12 MED ORDER — LIDOCAINE HCL (PF) 2 % IJ SOLN
INTRAMUSCULAR | Status: AC
  Filled 2020-01-12: qty 5

## 2020-01-12 MED ORDER — LACTATED RINGERS IV SOLN: INTRAVENOUS | Status: DC | PRN

## 2020-01-12 MED ORDER — PHENYLEPHRINE HCL (PRESSORS) 10 MG/ML IV SOLN
INTRAVENOUS | Status: DC | PRN
  Administered 2020-01-12 (×5): 80 ug via INTRAVENOUS

## 2020-01-12 MED ORDER — SODIUM CHLORIDE 0.9 % IV SOLN: INTRAVENOUS | Status: DC | PRN

## 2020-01-12 MED ORDER — NALOXONE HCL 4 MG/10ML IJ SOLN
1.0000 ug/kg/h | INTRAVENOUS | Status: DC | PRN
  Filled 2020-01-12: qty 5

## 2020-01-12 MED ORDER — CEFAZOLIN SODIUM-DEXTROSE 2-4 GM/100ML-% IV SOLN
2.0000 g | INTRAVENOUS | Status: AC
  Administered 2020-01-12: 2 g via INTRAVENOUS

## 2020-01-12 MED ORDER — POVIDONE-IODINE 10 % EX SWAB: 2.0000 "application " | Freq: Once | CUTANEOUS | Status: DC

## 2020-01-12 MED ORDER — MORPHINE SULFATE (PF) 0.5 MG/ML IJ SOLN
INTRAMUSCULAR | Status: DC | PRN
  Administered 2020-01-12: 3 mg via EPIDURAL

## 2020-01-12 MED ORDER — NALBUPHINE HCL 10 MG/ML IJ SOLN: 5.0000 mg | Freq: Once | INTRAMUSCULAR | Status: DC | PRN

## 2020-01-12 MED ORDER — LACTATED RINGERS IV SOLN: INTRAVENOUS | Status: DC

## 2020-01-12 MED ORDER — NALBUPHINE HCL 10 MG/ML IJ SOLN: 5.0000 mg | INTRAMUSCULAR | Status: DC | PRN

## 2020-01-12 MED ORDER — OXYCODONE-ACETAMINOPHEN 5-325 MG PO TABS: 2.0000 | ORAL_TABLET | ORAL | Status: DC | PRN

## 2020-01-12 MED ORDER — OXYTOCIN-SODIUM CHLORIDE 30-0.9 UT/500ML-% IV SOLN
1.0000 m[IU]/min | INTRAVENOUS | Status: DC
  Administered 2020-01-12: 2 m[IU]/min via INTRAVENOUS
  Filled 2020-01-12: qty 500

## 2020-01-12 MED ORDER — MORPHINE SULFATE (PF) 0.5 MG/ML IJ SOLN
INTRAMUSCULAR | Status: AC
  Filled 2020-01-12: qty 10

## 2020-01-12 MED ORDER — ONDANSETRON HCL 4 MG/2ML IJ SOLN: 4.0000 mg | Freq: Three times a day (TID) | INTRAMUSCULAR | Status: DC | PRN

## 2020-01-12 MED ORDER — FENTANYL CITRATE (PF) 100 MCG/2ML IJ SOLN: 25.0000 ug | INTRAMUSCULAR | Status: DC | PRN

## 2020-01-12 MED ORDER — LIDOCAINE-EPINEPHRINE (PF) 2 %-1:200000 IJ SOLN
INTRAMUSCULAR | Status: DC | PRN
  Administered 2020-01-12: 10 mL via EPIDURAL
  Administered 2020-01-12: 5 mL via EPIDURAL

## 2020-01-12 MED ORDER — ACETAMINOPHEN 325 MG PO TABS: 650.0000 mg | ORAL_TABLET | ORAL | Status: DC | PRN

## 2020-01-12 MED ORDER — CEFAZOLIN SODIUM-DEXTROSE 2-4 GM/100ML-% IV SOLN
INTRAVENOUS | Status: AC
  Filled 2020-01-12: qty 100

## 2020-01-12 MED ORDER — KETOROLAC TROMETHAMINE 30 MG/ML IJ SOLN: 30.0000 mg | Freq: Once | INTRAMUSCULAR | Status: AC | PRN

## 2020-01-12 MED ORDER — KETOROLAC TROMETHAMINE 30 MG/ML IJ SOLN
INTRAMUSCULAR | Status: AC
  Filled 2020-01-12: qty 1

## 2020-01-12 MED ORDER — LACTATED RINGERS AMNIOINFUSION
INTRAVENOUS | Status: DC
  Administered 2020-01-12: 75 mL via INTRAUTERINE

## 2020-01-12 MED ORDER — ACETAMINOPHEN 500 MG PO TABS
1000.0000 mg | ORAL_TABLET | Freq: Four times a day (QID) | ORAL | Status: AC
  Administered 2020-01-13 (×4): 1000 mg via ORAL
  Filled 2020-01-12 (×4): qty 2

## 2020-01-12 MED ORDER — ACETAMINOPHEN 10 MG/ML IV SOLN: 1000.0000 mg | Freq: Once | INTRAVENOUS | Status: DC | PRN

## 2020-01-12 MED ORDER — OXYTOCIN-SODIUM CHLORIDE 30-0.9 UT/500ML-% IV SOLN
INTRAVENOUS | Status: DC | PRN
  Administered 2020-01-12: 30 [IU] via INTRAVENOUS

## 2020-01-12 MED ORDER — KETOROLAC TROMETHAMINE 30 MG/ML IJ SOLN
30.0000 mg | Freq: Four times a day (QID) | INTRAMUSCULAR | Status: AC | PRN
  Administered 2020-01-12: 30 mg via INTRAMUSCULAR

## 2020-01-12 MED ORDER — ONDANSETRON HCL 4 MG/2ML IJ SOLN
INTRAMUSCULAR | Status: AC
  Filled 2020-01-12: qty 4

## 2020-01-12 MED ORDER — LACTATED RINGERS IV BOLUS
500.0000 mL | Freq: Once | INTRAVENOUS | Status: AC
  Administered 2020-01-12: 500 mL via INTRAVENOUS

## 2020-01-12 MED ORDER — DIPHENHYDRAMINE HCL 50 MG/ML IJ SOLN: 12.5000 mg | INTRAMUSCULAR | Status: DC | PRN

## 2020-01-12 MED ORDER — SOD CITRATE-CITRIC ACID 500-334 MG/5ML PO SOLN: 30.0000 mL | ORAL | Status: DC | PRN

## 2020-01-12 MED ORDER — OXYCODONE-ACETAMINOPHEN 5-325 MG PO TABS: 1.0000 | ORAL_TABLET | ORAL | Status: DC | PRN

## 2020-01-12 MED ORDER — KETOROLAC TROMETHAMINE 30 MG/ML IJ SOLN: 30.0000 mg | Freq: Four times a day (QID) | INTRAMUSCULAR | Status: AC | PRN

## 2020-01-12 MED ORDER — LIDOCAINE HCL (PF) 1 % IJ SOLN: 30.0000 mL | INTRAMUSCULAR | Status: DC | PRN

## 2020-01-12 MED ORDER — NALOXONE HCL 0.4 MG/ML IJ SOLN: 0.4000 mg | INTRAMUSCULAR | Status: DC | PRN

## 2020-01-12 MED ORDER — OXYTOCIN BOLUS FROM INFUSION: 333.0000 mL | Freq: Once | INTRAVENOUS | Status: DC

## 2020-01-12 MED ORDER — OXYTOCIN-SODIUM CHLORIDE 30-0.9 UT/500ML-% IV SOLN
INTRAVENOUS | Status: AC
  Filled 2020-01-12: qty 500

## 2020-01-12 MED ORDER — PHENYLEPHRINE 40 MCG/ML (10ML) SYRINGE FOR IV PUSH (FOR BLOOD PRESSURE SUPPORT)
PREFILLED_SYRINGE | INTRAVENOUS | Status: AC
  Administered 2020-01-12: 400 ug
  Filled 2020-01-12: qty 10

## 2020-01-12 MED ORDER — LACTATED RINGERS IV SOLN
500.0000 mL | INTRAVENOUS | Status: DC | PRN
  Administered 2020-01-12: 300 mL via INTRAVENOUS

## 2020-01-12 MED ORDER — SODIUM CHLORIDE 0.9% FLUSH: 3.0000 mL | INTRAVENOUS | Status: DC | PRN

## 2020-01-12 MED ORDER — STERILE WATER FOR IRRIGATION IR SOLN
Status: DC | PRN
  Administered 2020-01-12: 1000 mL

## 2020-01-12 MED ORDER — SCOPOLAMINE 1 MG/3DAYS TD PT72
1.0000 | MEDICATED_PATCH | Freq: Once | TRANSDERMAL | Status: DC
  Administered 2020-01-13: 1.5 mg via TRANSDERMAL
  Filled 2020-01-12: qty 1

## 2020-01-12 SURGICAL SUPPLY — 39 items
BENZOIN TINCTURE PRP APPL 2/3 (GAUZE/BANDAGES/DRESSINGS) ×2 IMPLANT
CHLORAPREP W/TINT 26ML (MISCELLANEOUS) ×2 IMPLANT
CLAMP CORD UMBIL (MISCELLANEOUS) IMPLANT
CLOTH BEACON ORANGE TIMEOUT ST (SAFETY) ×2 IMPLANT
DRSG OPSITE POSTOP 4X10 (GAUZE/BANDAGES/DRESSINGS) ×2 IMPLANT
ELECT REM PT RETURN 9FT ADLT (ELECTROSURGICAL) ×2
ELECTRODE REM PT RTRN 9FT ADLT (ELECTROSURGICAL) ×1 IMPLANT
EXTRACTOR VACUUM KIWI (MISCELLANEOUS) IMPLANT
EXTRACTOR VACUUM M CUP 4 TUBE (SUCTIONS) IMPLANT
GLOVE BIO SURGEON STRL SZ7 (GLOVE) ×2 IMPLANT
GLOVE BIOGEL PI IND STRL 7.0 (GLOVE) ×2 IMPLANT
GLOVE BIOGEL PI INDICATOR 7.0 (GLOVE) ×2
GOWN STRL REUS W/TWL LRG LVL3 (GOWN DISPOSABLE) ×4 IMPLANT
HOVERMATT SINGLE USE (MISCELLANEOUS) ×2 IMPLANT
KIT ABG SYR 3ML LUER SLIP (SYRINGE) IMPLANT
NEEDLE HYPO 25X5/8 SAFETYGLIDE (NEEDLE) IMPLANT
NS IRRIG 1000ML POUR BTL (IV SOLUTION) ×2 IMPLANT
PACK C SECTION WH (CUSTOM PROCEDURE TRAY) ×2 IMPLANT
PAD ABD 7.5X8 STRL (GAUZE/BANDAGES/DRESSINGS) ×2 IMPLANT
PAD OB MATERNITY 4.3X12.25 (PERSONAL CARE ITEMS) ×2 IMPLANT
RTRCTR C-SECT PINK 25CM LRG (MISCELLANEOUS) IMPLANT
SPONGE GAUZE 4X4 12PLY STER LF (GAUZE/BANDAGES/DRESSINGS) ×4 IMPLANT
STRIP CLOSURE SKIN 1/2X4 (GAUZE/BANDAGES/DRESSINGS) IMPLANT
STRIP CLOSURE SKIN 1/4X3 (GAUZE/BANDAGES/DRESSINGS) ×2 IMPLANT
SUT MNCRL 0 VIOLET CTX 36 (SUTURE) ×2 IMPLANT
SUT MONOCRYL 0 CTX 36 (SUTURE) ×2
SUT PLAIN 0 NONE (SUTURE) IMPLANT
SUT PLAIN 2 0 (SUTURE)
SUT PLAIN 2 0 XLH (SUTURE) ×2 IMPLANT
SUT PLAIN ABS 2-0 CT1 27XMFL (SUTURE) IMPLANT
SUT VIC AB 0 CT1 27 (SUTURE) ×2
SUT VIC AB 0 CT1 27XBRD ANBCTR (SUTURE) ×2 IMPLANT
SUT VIC AB 2-0 CT1 27 (SUTURE) ×1
SUT VIC AB 2-0 CT1 TAPERPNT 27 (SUTURE) ×1 IMPLANT
SUT VIC AB 4-0 KS 27 (SUTURE) ×2 IMPLANT
SUT VICRYL 0 TIES 12 18 (SUTURE) IMPLANT
TOWEL OR 17X24 6PK STRL BLUE (TOWEL DISPOSABLE) ×2 IMPLANT
TRAY FOLEY W/BAG SLVR 14FR LF (SET/KITS/TRAYS/PACK) IMPLANT
WATER STERILE IRR 1000ML POUR (IV SOLUTION) ×2 IMPLANT

## 2020-01-12 NOTE — Progress Notes (Signed)
S: Bouncing on birthing ball, feeling only mild cramping. Husband and doula at the bedside providing support. Discussed epidural placement and insertion of IUPC and FSE after comfortable for accurate labor assessment. Patient consents to epidural now.   O: Vitals:   01/12/20 1801 01/12/20 1831 01/12/20 1901 01/12/20 1904  BP: 133/81 114/67 (!) 109/57   Pulse: 88 81 78   Resp: 18 18  20   Temp:    97.7 F (36.5 C)  TempSrc:    Oral  SpO2:      Weight:      Height:       FHT:  FHR: 135 bpm, variability: moderate,  accelerations:  Present,  decelerations:  Absent UC:   Unable to trace SVE:   Dilation: 3 Effacement (%): 80 Station: -3 Exam by:: [email or web address], CNM  A / P: Induction of labor due to postterm, s/p 2 doses of buccal Cytotec, s/p foley balloon, Pitocin infusing at 32mu, heavy meconium  Fetal Wellbeing:  Category I Pain Control:  Labor support without medications Anticipated MOD:  Guarded   Patient will get epidural now. Once comfortable, will place IUPC and FSE. Will increase Pitocin until adequate.  26m, CNM, MSN 01/12/2020, 7:42 PM

## 2020-01-12 NOTE — Transfer of Care (Signed)
Immediate Anesthesia Transfer of Care Note  Patient: Vanessa Keith  Procedure(s) Performed: CESAREAN SECTION (N/A )  Patient Location: PACU  Anesthesia Type:Epidural  Level of Consciousness: awake, alert  and patient cooperative  Airway & Oxygen Therapy: Patient Spontanous Breathing  Post-op Assessment: Report given to RN, Post -op Vital signs reviewed and stable and Patient moving all extremities X 4  Post vital signs: Reviewed and stable  Last Vitals:  Vitals Value Taken Time  BP 126/93 01/12/20 2230  Temp    Pulse 92 01/12/20 2231  Resp 23 01/12/20 2231  SpO2 100 % 01/12/20 2231  Vitals shown include unvalidated device data.  Last Pain:  Vitals:   01/12/20 1935  TempSrc:   PainSc: 3       Patients Stated Pain Goal: 8 (01/12/20 0800)  Complications: No complications documented.

## 2020-01-12 NOTE — Anesthesia Preprocedure Evaluation (Signed)
Anesthesia Evaluation  Patient identified by MRN, date of birth, ID band Patient awake    Reviewed: Allergy & Precautions, NPO status , Patient's Chart, lab work & pertinent test results  Airway Mallampati: II  TM Distance: >3 FB Neck ROM: Full    Dental no notable dental hx.    Pulmonary neg pulmonary ROS,    Pulmonary exam normal breath sounds clear to auscultation       Cardiovascular Normal cardiovascular exam Rhythm:Regular Rate:Normal  HLD   Neuro/Psych negative neurological ROS  negative psych ROS   GI/Hepatic Neg liver ROS, GERD  ,  Endo/Other  Morbid obesity (BMI 42)  Renal/GU negative Renal ROS  negative genitourinary   Musculoskeletal negative musculoskeletal ROS (+)   Abdominal   Peds  Hematology negative hematology ROS (+)   Anesthesia Other Findings IOL for post dates  Reproductive/Obstetrics (+) Pregnancy                             Anesthesia Physical Anesthesia Plan  ASA: III  Anesthesia Plan: Epidural   Post-op Pain Management:    Induction:   PONV Risk Score and Plan: Treatment may vary due to age or medical condition  Airway Management Planned: Natural Airway  Additional Equipment:   Intra-op Plan:   Post-operative Plan:   Informed Consent: I have reviewed the patients History and Physical, chart, labs and discussed the procedure including the risks, benefits and alternatives for the proposed anesthesia with the patient or authorized representative who has indicated his/her understanding and acceptance.       Plan Discussed with: Anesthesiologist  Anesthesia Plan Comments: (Patient identified. Risks, benefits, options discussed with patient including but not limited to bleeding, infection, nerve damage, paralysis, failed block, incomplete pain control, headache, blood pressure changes, nausea, vomiting, reactions to medication, itching, and post  partum back pain. Confirmed with bedside nurse the patient's most recent platelet count. Confirmed with the patient that they are not taking any anticoagulation, have any bleeding history or any family history of bleeding disorders. Patient expressed understanding and wishes to proceed. All questions were answered. )        Anesthesia Quick Evaluation

## 2020-01-12 NOTE — Op Note (Signed)
Cesarean Section Procedure Note   Vanessa Keith 01/12/2020 Procedure: Primary Low transverse cesarean section   Indications: Fetal intolerance to labor, arrest of dilatation and descent    Pre-operative Diagnosis: primary cesarean section for arrest of dilation and fetal intolerance of labor.   Post-operative Diagnosis: Same   Surgeon: Shea Evans, MD  Assistants: Dorisann Frames, CNM  Anesthesia: epidural   Procedure Details:  The patient was seen in the Holding Room. The risks, benefits, complications, treatment options, and expected outcomes were discussed with the patient. The patient concurred with the proposed plan, giving informed consent. identified as [name] and the procedure verified as C-Section Delivery. A Time Out was held and the above information confirmed.  After induction of anesthesia, the patient was draped and prepped in the usual sterile manner, foley was draining urine well.  A pfannenstiel incision was made and carried down through the subcutaneous tissue to the fascia. Fascial incision was made and extended transversely. The fascia was separated from the underlying rectus tissue superiorly and inferiorly. The peritoneum was identified and entered. Peritoneal incision was extended longitudinally. Alexis-O retractor placed. The utero-vesical peritoneal reflection was incised transversely and the bladder flap was bluntly freed from the lower uterine segment. A low transverse uterine incision was made. Thick meconium noted. Baby was unengaged, floating, ROT position. Delivered cephalic, loose nuchal cord released over the head and then body delivered. Bulb suctioned nares and oral cavity. Baby BOY, born at 21.32 hours, had good cry. Apgar scores of 9 at one minute and 9 at five minutes. Delayed cord clamping done at 1 minute and baby handed to NICU team in attendance. Cord ph was attempted but failed due to very thin cord and unable to get arterial sample. Cord  blood was obtained for evaluation. Membranes were densely adherent and yellow/green from thick meconium. The placenta was removed Intact and appeared normal. The uterine outline, tubes and ovaries appeared normal. The uterine incision was closed with running locked sutures of . A second imbricating layer sutured. Hemostasis was observed. Alexis retractor removed. Peritoneal closure done with 2-0 Vicryl.  The fascia was then reapproximated with running sutures of 0Vicryl. The subcuticular closure was performed using 2-0plain gut. The skin was closed with 4-0Vicryl. Steristrips and Honeycomb dressing placed.  Instrument, sponge, and needle counts were correct prior the abdominal closure and were correct at the conclusion of the case.   Findings: Thick meconium, loose nuchal cord, unengaged fetal head at delivery. Apgars 9 and 9. 2 layer closure of Kerr hysterotomy.   Estimated Blood Loss: 646 ml  Total IV Fluids: 2800 ml LR  Urine Output: 100CC OF clear urine  Specimens: cord blood    Complications: no complications  Disposition: PACU - hemodynamically stable.   Maternal Condition: stable   Baby condition / location:  Couplet care / Skin to Skin  Attending Attestation: I performed the procedure.   Signed: Surgeon(s): Shea Evans, MD

## 2020-01-12 NOTE — Progress Notes (Addendum)
S: Feeling mild cramping with contractions. Using birth ball with husband and doula, Shanda Bumps, present and supportive. Discussed the R/B/A of AROM for labor induction and patient consents to procedure.   O: Vitals:   01/12/20 1510 01/12/20 1515 01/12/20 1520 01/12/20 1530  BP:    118/68  Pulse:    87  Resp:    18  Temp:      TempSrc:      SpO2: 96% 95% 98%   Weight:      Height:       FHT:  FHR: 155 bpm, variability: moderate,  accelerations:  Present,  decelerations:  Present occasional variable declerations, isolated prolonged deceleration with moderate varaibility and return to baseline with 3 minutes UC:   irregular, every 3-5 minutes SVE:   Dilation: 3 Effacement (%): 80 Station: -3 Exam by:: Vanessa Keith, CNM  AROM with heavy meconium at 1517  A / P: Induction of labor due to postterm, s/p 2 doses of buccal Cytotec, s/p foley balloon, Pitocin infusing @ 79mu, heavy meconium on AROM  Fetal Wellbeing:  Category I Pain Control:  Labor support without medications Anticipated MOD:  Guarded  Will increase Pitocin 2x2 until adequate contractions. SVE remains unchanged.  Dr. Juliene Pina updated on patient status and plan of care.   Vanessa Keith, CNM, MSN 01/12/2020, 3:47 PM

## 2020-01-12 NOTE — Progress Notes (Signed)
S: Feeling more cramping. Discussed the R/B/A of foley balloon placement for cervical ripening and patient consents to procedure. Husband at the bedside providing support.   O: Vitals:   01/11/20 1845 01/11/20 1939 01/11/20 2122 01/12/20 0105  BP: 109/63 127/82 (!) 141/81 121/72  Pulse: 90 98 87 91  Resp: 16 16 16    Temp:   98.3 F (36.8 C)   TempSrc:   Oral   Weight:      Height:       FHT:  FHR: 130 bpm, variability: moderate,  accelerations:  Present,  decelerations:  Present Occasional variables to 80-90s lasting less than 1 minute UC:   regular, every 3-5 minutes SVE:   Dilation: 1 Effacement (%): 60 Station: -2 Exam by:: [email or web address], CNM  Foley balloon placed without difficulty. 60cc of fluid instilled in balloon and catheter taped to leg.  A / P: Induction of labor due to postterm, s/p 2 doses of buccal Cytotec  Fetal Wellbeing:  Category I Pain Control:  Labor support without medications Anticipated MOD:  NSVD   Will start Pitocin 2x2. Consider AROM when foley balloon is expelled.   Dorisann Frames, CNM, MSN 01/12/2020, 2:02 AM

## 2020-01-12 NOTE — Progress Notes (Signed)
Patient ID: Vanessa Keith, female   DOB: Mar 04, 1991, 29 y.o.   MRN: [id number] IOL 41.1 wks, long induction since last evening. Now 3 cm, thick/ high stn. With prolonged FHT decels, remote from delivery, proceed with C-section.  Risks/complications of surgery reviewed incl infection, bleeding, damage to internal organs including bladder, bowels, ureters, blood vessels, other risks from anesthesia, VTE and delayed complications of any surgery, complications in future surgery reviewed. Also discussed neonatal complications incl difficult delivery, laceration, vacuum assistance, TTN etc. Pt understands and agrees, all concerns addressed.    Pt understands and agrees, NKDAs.   Hilary Hertz MD

## 2020-01-12 NOTE — Anesthesia Postprocedure Evaluation (Signed)
Anesthesia Post Note  Patient: Vanessa Keith  Procedure(s) Performed: CESAREAN SECTION (N/A )     Patient location during evaluation: PACU Anesthesia Type: Epidural Level of consciousness: awake and alert Pain management: pain level controlled Vital Signs Assessment: post-procedure vital signs reviewed and stable Respiratory status: spontaneous breathing, nonlabored ventilation and respiratory function stable Cardiovascular status: stable Postop Assessment: no headache, no backache and epidural receding Anesthetic complications: no   No complications documented.  Last Vitals:  Vitals:   01/12/20 2330 01/12/20 2345  BP: 116/68 108/71  Pulse: 78 72  Resp: 18 18  Temp:    SpO2: 98% 98%    Last Pain:  Vitals:   01/12/20 2345  TempSrc:   PainSc: 0-No pain   Pain Goal: Patients Stated Pain Goal: 8 (01/12/20 0800)              Epidural/Spinal Function Cutaneous sensation: Able to Wiggle Toes (01/12/20 2330), Patient able to flex knees: Yes (01/12/20 2330), Patient able to lift hips off bed: Yes (01/12/20 2330), Back pain beyond tenderness at insertion site: No (01/12/20 2330), Progressively worsening motor and/or sensory loss: No (01/12/20 2330), Bowel and/or bladder incontinence post epidural: No (01/12/20 2330)  [name] L Faruq Rosenberger

## 2020-01-12 NOTE — Anesthesia Procedure Notes (Signed)
Epidural Patient location during procedure: OB Start time: 01/12/2020 8:00 PM End time: 01/12/2020 8:11 PM  Staffing Anesthesiologist: Elmer Picker, MD Performed: anesthesiologist   Preanesthetic Checklist Completed: patient identified, IV checked, risks and benefits discussed, monitors and equipment checked, pre-op evaluation and timeout performed  Epidural Patient position: sitting Prep: DuraPrep and site prepped and draped Patient monitoring: continuous pulse ox, blood pressure, heart rate and cardiac monitor Approach: midline Location: L3-L4 Injection technique: LOR air  Needle:  Needle type: Tuohy  Needle gauge: 17 G Needle length: 9 cm Needle insertion depth: 6 cm Catheter type: closed end flexible Catheter size: 19 Gauge Catheter at skin depth: 12 cm Test dose: negative  Assessment Sensory level: T8 Events: blood not aspirated, injection not painful, no injection resistance, no paresthesia and negative IV test  Additional Notes Patient identified. Risks/Benefits/Options discussed with patient including but not limited to bleeding, infection, nerve damage, paralysis, failed block, incomplete pain control, headache, blood pressure changes, nausea, vomiting, reactions to medication both or allergic, itching and postpartum back pain. Confirmed with bedside nurse the patient's most recent platelet count. Confirmed with patient that they are not currently taking any anticoagulation, have any bleeding history or any family history of bleeding disorders. Patient expressed understanding and wished to proceed. All questions were answered. Sterile technique was used throughout the entire procedure. Please see nursing notes for vital signs. Test dose was given through epidural catheter and negative prior to continuing to dose epidural or start infusion. Warning signs of high block given to the patient including shortness of breath, tingling/numbness in hands, complete motor block,  or any concerning symptoms with instructions to call for help. Patient was given instructions on fall risk and not to get out of bed. All questions and concerns addressed with instructions to call with any issues or inadequate analgesia.  Reason for block:procedure for pain

## 2020-01-12 NOTE — Progress Notes (Signed)
S: Doing well, feeling more pain with contractions.   O: Vitals:   01/12/20 1031 01/12/20 1101 01/12/20 1131 01/12/20 1201  BP: 128/72 117/75 (!) 102/51 119/65  Pulse: 77 77 83 77  Resp: 20 18 20 18   Temp:      TempSrc:      Weight:      Height:       FHT:  FHR: 135 bpm, variability: moderate,  accelerations:  Present,  decelerations:  Absent UC:   regular, every 2-4 minutes SVE:   Dilation: 3.5 Effacement (%): 80 Station: -3 Exam by:: [email or web address], RN   Pitocin on at 48mu  Foley balloon removed intact with small amount of bloody show.  A / P: Induction of labor due to postterm, s/p 2 doses of buccal Cytotec, foley balloon  Fetal Wellbeing:  Category I Pain Control:  Labor support without medications Anticipated MOD:  NSVD   Continue Pitocin at 22mu for 2 hours to allow patient to rest. Will consider AROM at next SVE.   12m, CNM, MSN 01/12/2020, 12:16 PM

## 2020-01-13 ENCOUNTER — Encounter (HOSPITAL_COMMUNITY): Payer: Self-pay | Admitting: Obstetrics & Gynecology

## 2020-01-13 DIAGNOSIS — Z98891 History of uterine scar from previous surgery: Secondary | ICD-10-CM

## 2020-01-13 DIAGNOSIS — O99892 Other specified diseases and conditions complicating childbirth: Secondary | ICD-10-CM | POA: Diagnosis present

## 2020-01-13 LAB — CBC
HCT: 33.9 % — ABNORMAL LOW (ref 36.0–46.0)
Hemoglobin: 10.9 g/dL — ABNORMAL LOW (ref 12.0–15.0)
MCH: 29.1 pg (ref 26.0–34.0)
MCHC: 32.2 g/dL (ref 30.0–36.0)
MCV: 90.4 fL (ref 80.0–100.0)
Platelets: 177 10*3/uL (ref 150–400)
RBC: 3.75 MIL/uL — ABNORMAL LOW (ref 3.87–5.11)
RDW: 13.9 % (ref 11.5–15.5)
WBC: 15.8 10*3/uL — ABNORMAL HIGH (ref 4.0–10.5)
nRBC: 0 % (ref 0.0–0.2)

## 2020-01-13 MED ORDER — SIMETHICONE 80 MG PO CHEW
80.0000 mg | CHEWABLE_TABLET | ORAL | Status: DC
  Administered 2020-01-13 – 2020-01-15 (×3): 80 mg via ORAL
  Filled 2020-01-13 (×3): qty 1

## 2020-01-13 MED ORDER — OXYCODONE HCL 5 MG PO TABS: 5.0000 mg | ORAL_TABLET | ORAL | Status: DC | PRN

## 2020-01-13 MED ORDER — KETOROLAC TROMETHAMINE 30 MG/ML IJ SOLN
30.0000 mg | Freq: Four times a day (QID) | INTRAMUSCULAR | Status: AC
  Administered 2020-01-13 (×3): 30 mg via INTRAVENOUS
  Filled 2020-01-13 (×3): qty 1

## 2020-01-13 MED ORDER — SENNOSIDES-DOCUSATE SODIUM 8.6-50 MG PO TABS
2.0000 | ORAL_TABLET | ORAL | Status: DC
  Administered 2020-01-14 – 2020-01-15 (×2): 2 via ORAL
  Filled 2020-01-13 (×3): qty 2

## 2020-01-13 MED ORDER — COCONUT OIL OIL
1.0000 "application " | TOPICAL_OIL | Status: DC | PRN
  Administered 2020-01-14: 1 via TOPICAL

## 2020-01-13 MED ORDER — WITCH HAZEL-GLYCERIN EX PADS: 1.0000 "application " | MEDICATED_PAD | CUTANEOUS | Status: DC | PRN

## 2020-01-13 MED ORDER — SIMETHICONE 80 MG PO CHEW
80.0000 mg | CHEWABLE_TABLET | Freq: Three times a day (TID) | ORAL | Status: DC
  Administered 2020-01-13 – 2020-01-14 (×4): 80 mg via ORAL
  Filled 2020-01-13 (×4): qty 1

## 2020-01-13 MED ORDER — IBUPROFEN 800 MG PO TABS
800.0000 mg | ORAL_TABLET | Freq: Four times a day (QID) | ORAL | Status: DC
  Administered 2020-01-14 – 2020-01-15 (×6): 800 mg via ORAL
  Filled 2020-01-13 (×6): qty 1

## 2020-01-13 MED ORDER — PRENATAL MULTIVITAMIN CH
1.0000 | ORAL_TABLET | Freq: Every day | ORAL | Status: DC
  Administered 2020-01-13 – 2020-01-14 (×2): 1 via ORAL
  Filled 2020-01-13 (×2): qty 1

## 2020-01-13 MED ORDER — PANTOPRAZOLE SODIUM 20 MG PO TBEC
20.0000 mg | DELAYED_RELEASE_TABLET | Freq: Every day | ORAL | Status: DC
  Administered 2020-01-13 – 2020-01-14 (×2): 20 mg via ORAL
  Filled 2020-01-13 (×2): qty 1

## 2020-01-13 MED ORDER — DIPHENHYDRAMINE HCL 25 MG PO CAPS: 25.0000 mg | ORAL_CAPSULE | Freq: Four times a day (QID) | ORAL | Status: DC | PRN

## 2020-01-13 MED ORDER — MENTHOL 3 MG MT LOZG: 1.0000 | LOZENGE | OROMUCOSAL | Status: DC | PRN

## 2020-01-13 MED ORDER — LACTATED RINGERS IV SOLN: INTRAVENOUS | Status: DC

## 2020-01-13 MED ORDER — TETANUS-DIPHTH-ACELL PERTUSSIS 5-2.5-18.5 LF-MCG/0.5 IM SUSP: 0.5000 mL | Freq: Once | INTRAMUSCULAR | Status: DC

## 2020-01-13 MED ORDER — DIBUCAINE (PERIANAL) 1 % EX OINT: 1.0000 "application " | TOPICAL_OINTMENT | CUTANEOUS | Status: DC | PRN

## 2020-01-13 MED ORDER — OXYTOCIN-SODIUM CHLORIDE 30-0.9 UT/500ML-% IV SOLN: 2.5000 [IU]/h | INTRAVENOUS | Status: AC

## 2020-01-13 MED ORDER — SIMETHICONE 80 MG PO CHEW: 80.0000 mg | CHEWABLE_TABLET | ORAL | Status: DC | PRN

## 2020-01-13 MED ORDER — ZOLPIDEM TARTRATE 5 MG PO TABS: 5.0000 mg | ORAL_TABLET | Freq: Every evening | ORAL | Status: DC | PRN

## 2020-01-13 MED ORDER — ACETAMINOPHEN 325 MG PO TABS
650.0000 mg | ORAL_TABLET | ORAL | Status: DC | PRN
  Administered 2020-01-14: 650 mg via ORAL
  Filled 2020-01-13 (×2): qty 2

## 2020-01-13 NOTE — Progress Notes (Signed)
S: Patient on side after epidural placement. Tearful and states she is scared about fetal well being. Husband and doula in room for support. Discussed placement of IUPC for adequate contraction monitoring and patient consents to procedure.   O: See flowsheet for vitals  FHT:  FHR: 135 bpm, variability: moderate,  accelerations:  Present,  decelerations: Prolonged deceleration to 60s with return to baseline at 6 minutes UC:   Unable to trace SVE:   Dilation: 3 Effacement (%): 80 Station: -3 Exam by:: Dorathy Daft, RN  FSE applied by RN prior to notifying CNM regarding deceleration. FSE on and tracing FHR 130s with moderate variability.   IUPC placed without difficulty.   A / P: Induction of labor due to postterm, s/p 2 doses of buccal Cytotec, s/p foley balloon, Pitocin off, heavy meconium  Fetal Wellbeing:  Category II Pain Control:  Epidural Anticipated MOD:  Guarded   Will start amnioinfusion now. Will start Pitocin 30 minutes after prolonged deceleration and increase until adequate.   Dr. Juliene Pina updated on patient status and plan of care.   June Leap, CNM, MSN 01/13/2020, 9:24 AM

## 2020-01-13 NOTE — Lactation Note (Signed)
This note was copied from a baby's chart. Lactation Consultation Note  Patient Name: Boy Cloteal Isaacson AOZHY'Q Date: 01/13/2020 Reason for consult: Initial assessment   P1, Baby 14 hours old.  Reviewed hand expression with good flow. Baby latches easily in football hold on L side and pushes off and on R side. Had mother prepump R side to help evert nipple. Feed on demand with cues.  Goal 8-12+ times per day after first 24 hrs.  Place baby STS if not cueing.  Mom made aware of O/P services, breastfeeding support groups, community resources, and our phone # for post-discharge questions.  Mother will call if further assistance was needed.     Maternal Data Has patient been taught Hand Expression?: Yes Does the patient have breastfeeding experience prior to this delivery?: No  Feeding Feeding Type: Breast Fed  LATCH Score Latch: Grasps breast easily, tongue down, lips flanged, rhythmical sucking. (On left, R side suggest mother prepump)  Audible Swallowing: A few with stimulation  Type of Nipple: Everted at rest and after stimulation  Comfort (Breast/Nipple): Soft / non-tender  Hold (Positioning): Assistance needed to correctly position infant at breast and maintain latch.  LATCH Score: 8  Interventions Interventions: Breast feeding basics reviewed;Assisted with latch;Skin to skin;Hand express;Pre-pump if needed;Position options;Support pillows;Adjust position  Lactation Tools Discussed/Used     Consult Status Consult Status: Follow-up Date: 01/14/20 Follow-up type: In-patient    Dahlia Byes [facility] 01/13/2020, 12:09 PM

## 2020-01-13 NOTE — Progress Notes (Signed)
S: Patient tearful. Husband and doula at the bedside providing support.   O: See flowsheet for vitals  FHT:  FHR: 13 bpm, variability: moderate,  accelerations: absent,  decelerations: Prolonged deceleration at 2043 to 50s with return to baseline at 5 minutes UC:   3-5 minutes  A / P: Induction of labor due to postterm, s/p 2 doses of buccal Cytotec, s/p foley balloon, Pitocin off, heavy meconium  Fetal Wellbeing:  Category II Pain Control:  Epidural Anticipated MOD:  Primary cesarean section for fetal intolerance to labor remote from delivery  Dr. Juliene Pina at the bedside.   Risk of C/S reviewed including infection, bleeding, possible need for blood transfusion and the risk including HIV, hepatitis, fever/rash, injury to surrounding organ structures and internal scar tissue. All questions answered.  Vanessa Keith, CNM, MSN 01/13/2020, 9:36 AM

## 2020-01-13 NOTE — Progress Notes (Signed)
POSTOPERATIVE DAY # 1 S/P Primary LTCS for fetal intolerance to labor, arrest of dilatation and descent, baby boy "Vanessa Keith"  S:         Reports feeling well, minimal soreness             Tolerating po intake / no nausea / no vomiting / no flatus / no BM  Denies dizziness, SOB, or CP             Bleeding is moderate             Pain controlled with Toradol and Tylenol             Up ad lib / ambulatory/ foley catheter removed at 12pm and no void yet   Newborn breast feeding  / Circumcision - planning   O:  VS: BP (!) 98/52   Pulse 66   Temp 98.3 F (36.8 C) (Oral)   Resp 17   Ht 5\' 5"  (1.651 m)   Wt 114.9 kg   SpO2 96%   Breastfeeding Unknown   BMI 42.15 kg/m   Patient Vitals for the past 24 hrs:  BP Temp Temp src Pulse Resp SpO2  01/13/20 1225 (!) 98/52 98.3 F (36.8 C) Oral 66 17 --  01/13/20 0830 (!) 103/51 98.8 F (37.1 C) Oral -- 18 --  01/13/20 0504 (!) 113/59 98.7 F (37.1 C) Oral -- -- 96 %  01/13/20 0056 (!) 102/55 98.2 F (36.8 C) Oral 71 -- 96 %  01/13/20 0000 110/61 (!) 100.4 F (38 C) Oral 86 18 98 %  01/12/20 2345 -- -- -- 72 18 98 %  01/12/20 2330 116/68 -- -- 78 18 98 %  01/12/20 2315 (!) 109/58 -- -- 80 18 99 %  01/12/20 2300 (!) 115/93 99.4 F (37.4 C) Oral 83 (!) 25 100 %  01/12/20 2245 97/83 -- -- 90 (!) 28 100 %  01/12/20 2230 (!) 126/93 98.9 F (37.2 C) Oral 93 (!) 23 100 %  01/12/20 2055 (!) 96/56 -- -- (!) 140 (!) 22 100 %  01/12/20 2045 123/80 -- -- (!) 101 18 99 %  01/12/20 2040 113/63 -- -- 99 18 99 %  01/12/20 2035 (!) 103/52 -- -- 98 -- 100 %  01/12/20 2030 (!) 69/51 -- -- 100 -- 99 %  01/12/20 2025 (!) 133/40 -- -- (!) 120 -- 99 %  01/12/20 2020 (!) 127/43 -- -- (!) 119 18 97 %  01/12/20 2016 -- -- -- -- -- 98 %  01/12/20 2015 140/73 -- -- (!) 101 18 --  01/12/20 1935 126/84 -- -- 81 18 --  01/12/20 1904 -- 97.7 F (36.5 C) Oral -- 20 --  01/12/20 1901 (!) 109/57 -- -- 78 -- --  01/12/20 1831 114/67 -- -- 81 18 --  01/12/20 1801  133/81 -- -- 88 18 --  01/12/20 1731 121/77 -- -- 93 20 --  01/12/20 1701 122/68 -- -- 80 18 --  01/12/20 1631 128/73 98.2 F (36.8 C) Axillary 94 20 --  01/12/20 1609 110/70 -- -- 90 -- --  01/12/20 1601 -- -- -- -- 20 --  01/12/20 1530 118/68 -- -- 87 18 --  01/12/20 1520 -- -- -- -- -- 98 %  01/12/20 1515 -- -- -- -- -- 95 %  01/12/20 1510 -- -- -- -- -- 96 %  01/12/20 1505 -- -- -- -- -- 96 %  01/12/20 1500 114/80 -- -- 89 18 96 %  01/12/20 1455 -- -- -- -- -- 96 %  01/12/20 1450 -- -- -- -- -- 96 %  01/12/20 1445 -- -- -- -- -- 96 %  01/12/20 1440 -- -- -- -- -- 95 %  01/12/20 1435 -- -- -- -- -- 95 %  01/12/20 1430 125/81 -- -- 87 20 96 %  01/12/20 1429 -- -- -- -- -- 96 %  01/12/20 1428 -- -- -- -- -- 96 %  01/12/20 1401 114/75 -- -- 83 20 --     LABS:              Recent Labs    01/11/20 1600 01/13/20 0559  WBC 11.6* 15.8*  HGB 13.4 10.9*  PLT 222 177               Bloodtype: --/--/O POS (10/20 1600)  Rubella: Immune (03/18 0000)                                             I&O: Intake/Output      10/21 0701 - 10/22 0700 10/22 0701 - 10/23 0700   I.V. (mL/kg) 2800 (24.4)    Total Intake(mL/kg) 2800 (24.4)    Urine (mL/kg/hr) 1000 (0.4)    Emesis/NG output 0    Blood 646    Total Output 1646    Net +1154         Emesis Occurrence 1 x                 Physical Exam:             Alert and Oriented X3  Lungs: Clear and unlabored  Heart: regular rate and rhythm / no murmurs  Abdomen: soft, non-tender, mild gaseous distention, active bowel sounds in all quadrants              Fundus: firm, non-tender, U-3             Dressing: pressure dressing c/d/i; honeycomb dressing underneath               Incision:  approximated with suture / unable to visualize due to pressure dressing   Perineum: intact  Lochia: small rubra on pad   Extremities: trace LE edema, no calf pain or tenderness  A/P:     POD # 1 S/P Primary LTCS            ABL Anemia    - Recommend  Niferex 150mg  PO daily and Magnesium oxide 400mg  PO daily starting tomorrow  Routine postoperative care              Lactation support PRN  Warm liquids and ambulation encouraged to promote bowel motility   Encouraged void trial every 2 hours to prevent overdistention   Continue current care   Circ prior to discharge   , MSN, CNM [facility]

## 2020-01-14 NOTE — Progress Notes (Signed)
Subjective: POSTOPERATIVE DAY # 2 S/P Primary LTCS for fetal intolerance to labor, arrest of dilatation and descent, baby boy "Cheree Ditto"  Live born female  Birth Weight: 6 lb 11.6 oz (3050 g) APGAR: 9, 9  Newborn Delivery   Birth date/time: 01/12/2019 21:32:00 Delivery type: C-Section, Low Transverse Trial of labor: Yes C-section categorization: Primary     Baby name: Cheree Ditto Delivering provider: MODY, VAISHALI   circumcision completed Feeding: breast  Pain control at delivery: Epidural   Reports feeling well, desires early DC home if able.  Patient reports tolerating PO.   Breast symptoms: + colostrum Pain controlled with PO meds Denies HA/SOB/C/P/N/V/dizziness. Flatus present. She reports vaginal bleeding as normal, without clots.  She is ambulating, urinating without difficulty.     Objective:   VS:    Vitals:   01/13/20 1225 01/13/20 1721 01/13/20 2243 01/14/20 0530  BP: (!) 98/52 109/73 (!) 103/57 (!) 115/56  Pulse: 66 65 74 71  Resp: 17 17  17   Temp: 98.3 F (36.8 C) 98.4 F (36.9 C) 98.5 F (36.9 C) 98 F (36.7 C)  TempSrc: Oral Oral Oral   SpO2:   98% 97%  Weight:      Height:          Intake/Output Summary (Last 24 hours) at 01/14/2020 1143 Last data filed at 01/13/2020 1823 Gross per 24 hour  Intake 200 ml  Output 300 ml  Net -100 ml        Recent Labs    01/11/20 1600 01/13/20 0559  WBC 11.6* 15.8*  HGB 13.4 10.9*  HCT 41.0 33.9*  PLT 222 177     Blood type: --/--/O POS (10/20 1600)  Rubella: Immune (03/18 0000)  Vaccines: TDaP          UTD         Flu             At discharge                    COVID-19 UTD   Physical Exam:  General: alert, cooperative and no distress Abdomen: soft, nontender, normal bowel sounds Incision: clean, dry and intact Uterine Fundus: firm, below umbilicus, nontender Lochia: minimal Ext: edema +1 pedal, no cords or tenderness      Assessment/Plan: 29 y.o.   POD# 2. [id number]                   Principal Problem:   Postpartum care following cesarean delivery 10/21 Active Problems:   Encounter for induction of labor   Failed induction of labor, NRFHTs, failure to progress   Status post primary low transverse cesarean section10/21   Delivery by emergency cesarean   Doing well, stable.    Routine post-op care Plan DC today if newborn is discharged by peds service  05-26-1989, CNM, MSN 01/14/2020, 11:43 AM

## 2020-01-14 NOTE — Lactation Note (Addendum)
This note was copied from a baby's chart. Lactation Consultation Note  Patient Name: Vanessa Keith HALPF'X Date: 01/14/2020  Baby Vanessa Fetterolf now 30 hours old. Entered room and infant in crib and mom reports he has not been feeding quite as good today as he was yesterday.  Mom reports waiting on stool and Circumcised today. Mom reports he has latched pretty well on her left breast but hasnt really latched on her right breast.   Infant awake and cuing in crib.  Asked mom if we could work on breastfeeding.  Mom agreed.  Assisted latching infant in laid back breastfeeding position on right breast.  Infant latched and breastfed well.  Showed dad how to help mom hold him and do some massage and compression to get more milk.  Infant chompy at first but as feeding progressed infant got better.  Mom reported comfort. Infantbreastfed fed 20 minutes and LC left mom and baby breastfeeding.  Urged massage hand expression and pumping past breastfeedings.  Gave supplement sheet.  Urged mom to offer 7-12 ml of expressed mothers milk past breastfeeding today via spoon.   Demo how to use DEBP.  Mom reports she has a DEBP at home.  Urged her to pump for 15 minutes on initiate setting.  Urged her to do this until he is breastfeeding well and starting to stool.   Urged her to call lactation as needed.   Maternal Data    Feeding    LATCH Score                   Interventions    Lactation Tools Discussed/Used     Consult Status      Marv Alfrey Michaelle Copas 01/14/2020, 4:55 PM

## 2020-01-15 MED ORDER — IBUPROFEN 800 MG PO TABS
800.0000 mg | ORAL_TABLET | Freq: Four times a day (QID) | ORAL | 0 refills | Status: DC
Start: 2020-01-15 — End: 2020-01-23

## 2020-01-15 MED ORDER — OXYCODONE HCL 5 MG PO TABS: 5.0000 mg | ORAL_TABLET | ORAL | 0 refills | Status: DC | PRN

## 2020-01-15 MED ORDER — ACETAMINOPHEN 325 MG PO TABS: 650.0000 mg | ORAL_TABLET | ORAL | 1 refills | Status: DC | PRN

## 2020-01-15 MED ORDER — COCONUT OIL OIL: 1.0000 "application " | TOPICAL_OIL | 0 refills | Status: DC | PRN

## 2020-01-15 NOTE — Lactation Note (Addendum)
This note was copied from a baby's chart. Lactation Consultation Note  Patient Name: Vanessa Keith YBFXO'V Date: 01/15/2020  Infant laying STS with mom on arrival.  [name] now 45 hours old. Mom reports she just trid to feed him that his hands were to his mouth but he feel asleep.  Mom reports it had been about three hours since he last ate.    Parents with questions regarding how often to feed.  Urged 8-12 or more times day based on cues and if he doesn't cue they may have to wake him. Mom reports she has a few ml that she pumped in the refrigerator.  Urged mom when she pumps past breastfeeding to feed it back to him.  Urged mom to try and massage/hand express and use DEBP past breastfeedings as much as possible and feed back all expressed mothers milk to him. Mom has supplement amount sheet for recommended supplement.  Stool was somewhat greenish black. Praised breastfeeding. Mom has [facility] handout. Urged to call lactation as needed.   Maternal Data    Feeding    LATCH Score                   Interventions    Lactation Tools Discussed/Used     Consult Status      Vanessa Keith Michaelle Copas 01/15/2020, 10:04 AM

## 2020-01-15 NOTE — Discharge Summary (Signed)
OB Discharge Summary  Patient Name: Vanessa Keith DOB: 10/20/1990 MRN: [id number]  Date of admission: 01/11/2020 Delivering provider: MODY, VAISHALI   Admitting diagnosis: Encounter for induction of labor [Z34.90] Personal history of previous postdates pregnancy [Z87.59] Delivery by emergency cesarean [O99.892] Intrauterine pregnancy: [redacted]w[redacted]d     Secondary diagnosis: Patient Active Problem List   Diagnosis Date Noted  . Failed induction of labor, NRFHTs, failure to progress 01/13/2020  . Status post primary low transverse cesarean section10/21 01/13/2020  . Postpartum care following cesarean delivery 10/21 01/13/2020  . Delivery by emergency cesarean 01/13/2020  . Encounter for induction of labor 01/11/2020  . Myalgia due to statin 03/12/2018  . Gastroesophageal reflux disease 09/04/2017  . History of vitamin D deficiency 03/31/2017  . Elevated cholesterol 03/06/2017  . Class 2 obesity without serious comorbidity in adult 12/12/2016  . Family history of thyroid disorder 12/12/2016    Date of discharge: 01/15/2020   Discharge diagnosis: Principal Problem:   Postpartum care following cesarean delivery 10/21 Active Problems:   Encounter for induction of labor   Failed induction of labor, NRFHTs, failure to progress   Status post primary low transverse cesarean section10/21   Delivery by emergency cesarean                                                           Post partum procedures:None  Augmentation: AROM, Pitocin, Cytotec and IP Foley Pain control: Epidural  Laceration:None  Episiotomy:None  Complications: None  Hospital course:  Induction of Labor With Cesarean Section   29 y.o. yo G1P1001 at [redacted]w[redacted]d was admitted to the hospital 01/11/2020 for induction of labor. Patient had a labor course significant for failure to progress and fetal intolerance to labor. The patient went for cesarean section due to Arrest of Dilation and Non-Reassuring FHR. Delivery details are as  follows: Membrane Rupture Time/Date: 3:17 PM ,01/12/2020   Delivery Method:C-Section, Low Transverse  Details of operation can be found in separate operative Note.  Patient had an uncomplicated postpartum course. She is ambulating, tolerating a regular diet, passing flatus, and urinating well.  Patient is discharged home in stable condition on 01/15/20.      Newborn Data: Birth date:01/12/2020  Birth time:9:32 PM  Gender:Female  Living status:Living  Apgars:9 ,9  Weight:3050 g                                 Physical exam  Vitals:   01/13/20 2243 01/14/20 0530 01/14/20 2120 01/15/20 0530  BP: (!) 103/57 (!) 115/56 125/76 119/62  Pulse: 74 71 76 77  Resp:  17 18 17   Temp: 98.5 F (36.9 C) 98 F (36.7 C) 98.2 F (36.8 C) 97.8 F (36.6 C)  TempSrc: Oral  Oral Oral  SpO2: 98% 97%  100%  Weight:      Height:       General: alert, cooperative and no distress Lochia: appropriate Uterine Fundus: firm Incision: Healing well with no significant drainage, Dressing is clean, dry, and intact DVT Evaluation: No evidence of DVT seen on physical exam. Labs: Lab Results  Component Value Date   WBC 15.8 (H) 01/13/2020   HGB 10.9 (L) 01/13/2020   HCT 33.9 (L) 01/13/2020   MCV 90.4 01/13/2020  PLT 177 01/13/2020   CMP Latest Ref Rng & Units 11/25/2018  Glucose 70 - 99 mg/dL 86  BUN 6 - 23 mg/dL 12  Creatinine 5.63 - 8.75 mg/dL 6.43  Sodium 329 - 518 mEq/L 138  Potassium 3.5 - 5.1 mEq/L 4.0  Chloride 96 - 112 mEq/L 102  CO2 19 - 32 mEq/L 28  Calcium 8.4 - 10.5 mg/dL 9.1  Total Protein 6.0 - 8.3 g/dL 6.7  Total Bilirubin 0.2 - 1.2 mg/dL 0.5  Alkaline Phos 39 - 117 U/L 52  AST 0 - 37 U/L 33  ALT 0 - 35 U/L 33   Edinburgh Postnatal Depression Scale Screening Tool 01/14/2020  I have been able to laugh and see the funny side of things. 0  I have looked forward with enjoyment to things. 0  I have blamed myself unnecessarily when things went wrong. 1  I have been anxious or worried  for no good reason. 1  I have felt scared or panicky for no good reason. 1  Things have been getting on top of me. 1  I have been so unhappy that I have had difficulty sleeping. 0  I have felt sad or miserable. 0  I have been so unhappy that I have been crying. 0  The thought of harming myself has occurred to me. 0  Edinburgh Postnatal Depression Scale Total 4   Vaccines: TDaP UTD         Flu    At discharge         COVID-19   UTD  Discharge instructions:  per After Visit Summary and [facility] booklet  After Visit Meds:  Allergies as of 01/15/2020   No Known Allergies     Medication List    STOP taking these medications   lansoprazole 15 MG capsule Commonly known as: PREVACID     TAKE these medications   acetaminophen 325 MG tablet Commonly known as: TYLENOL Take 2 tablets (650 mg total) by mouth every 4 (four) hours as needed for mild pain (temperature > 101.5.).   coconut oil Oil Apply 1 application topically as needed.   ibuprofen 800 MG tablet Commonly known as: ADVIL Take 1 tablet (800 mg total) by mouth every 6 (six) hours.   oxyCODONE 5 MG immediate release tablet Commonly known as: Oxy IR/ROXICODONE Take 1-2 tablets (5-10 mg total) by mouth every 4 (four) hours as needed for moderate pain.   PRENATAL 1 PO Take 1 tablet by mouth daily.      Diet: routine diet  Activity: Advance as tolerated. Pelvic rest for 6 weeks.   Newborn Data: Live born female  Birth Weight: 6 lb 11.6 oz (3050 g) APGAR: 9, 9  Newborn Delivery   Birth date/time: 01/12/2020 21:32:00 Delivery type: C-Section, Low Transverse Trial of labor: Yes C-section categorization: Primary     Named Cheree Ditto Baby Feeding: Breast Disposition:home with mother  Delivery Report:  Review the Delivery Report for details.    Follow up:  Follow-up Information    Obgyn, [employer]. Schedule an appointment as soon as possible for a visit in 2 week(s).   Why: Please make an appointment for 2  weeks postpartum with the midwives at [facility]. Contact information: [address] [place] [address] [phone]              June Leap, CNM, MSN 01/15/2020, 8:09 AM

## 2020-01-20 ENCOUNTER — Inpatient Hospital Stay (HOSPITAL_COMMUNITY)
Admission: AD | Admit: 2020-01-20 | Discharge: 2020-01-23 | DRG: 776 | Disposition: A | Discharge status: Home / Self Care | Payer: BC Managed Care – PPO | Attending: Obstetrics & Gynecology | Admitting: Obstetrics & Gynecology

## 2020-01-20 ENCOUNTER — Encounter (HOSPITAL_COMMUNITY): Payer: Self-pay | Admitting: Obstetrics & Gynecology

## 2020-01-20 DIAGNOSIS — J9811 Atelectasis: Secondary | ICD-10-CM | POA: Diagnosis not present

## 2020-01-20 DIAGNOSIS — O99893 Other specified diseases and conditions complicating puerperium: Secondary | ICD-10-CM | POA: Diagnosis present

## 2020-01-20 DIAGNOSIS — O1415 Severe pre-eclampsia, complicating the puerperium: Secondary | ICD-10-CM | POA: Diagnosis not present

## 2020-01-20 DIAGNOSIS — R110 Nausea: Secondary | ICD-10-CM | POA: Diagnosis not present

## 2020-01-20 DIAGNOSIS — N179 Acute kidney failure, unspecified: Secondary | ICD-10-CM | POA: Diagnosis not present

## 2020-01-20 DIAGNOSIS — O904 Postpartum acute kidney failure: Secondary | ICD-10-CM | POA: Diagnosis present

## 2020-01-20 DIAGNOSIS — Z20822 Contact with and (suspected) exposure to covid-19: Secondary | ICD-10-CM | POA: Diagnosis present

## 2020-01-20 DIAGNOSIS — J810 Acute pulmonary edema: Secondary | ICD-10-CM | POA: Diagnosis not present

## 2020-01-20 DIAGNOSIS — O9089 Other complications of the puerperium, not elsewhere classified: Secondary | ICD-10-CM | POA: Diagnosis not present

## 2020-01-20 DIAGNOSIS — R030 Elevated blood-pressure reading, without diagnosis of hypertension: Secondary | ICD-10-CM | POA: Diagnosis not present

## 2020-01-20 DIAGNOSIS — N289 Disorder of kidney and ureter, unspecified: Secondary | ICD-10-CM | POA: Diagnosis not present

## 2020-01-20 DIAGNOSIS — O1414 Severe pre-eclampsia complicating childbirth: Secondary | ICD-10-CM | POA: Diagnosis present

## 2020-01-20 LAB — COMPREHENSIVE METABOLIC PANEL
ALT: 12 U/L (ref 0–44)
AST: 14 U/L — ABNORMAL LOW (ref 15–41)
Albumin: 2.6 g/dL — ABNORMAL LOW (ref 3.5–5.0)
Alkaline Phosphatase: 80 U/L (ref 38–126)
Anion gap: 12 (ref 5–15)
BUN: 25 mg/dL — ABNORMAL HIGH (ref 6–20)
CO2: 21 mmol/L — ABNORMAL LOW (ref 22–32)
Calcium: 8.6 mg/dL — ABNORMAL LOW (ref 8.9–10.3)
Chloride: 108 mmol/L (ref 98–111)
Creatinine, Ser: 2.17 mg/dL — ABNORMAL HIGH (ref 0.44–1.00)
GFR, Estimated: 31 mL/min — ABNORMAL LOW (ref >60)
Glucose, Bld: 90 mg/dL (ref 70–99)
Potassium: 3.7 mmol/L (ref 3.5–5.1)
Sodium: 141 mmol/L (ref 135–145)
Total Bilirubin: 0.3 mg/dL (ref 0.3–1.2)
Total Protein: 5.5 g/dL — ABNORMAL LOW (ref 6.5–8.1)

## 2020-01-20 LAB — PROTEIN / CREATININE RATIO, URINE
Creatinine, Urine: 38.48 mg/dL
Creatinine, Urine: 39.1 mg/dL
Protein Creatinine Ratio: 11.51 mg/mg{Cre} — ABNORMAL HIGH (ref 0.00–0.15)
Total Protein, Urine: 443 mg/dL
Total Protein, Urine: <6 mg/dL

## 2020-01-20 LAB — URINALYSIS, ROUTINE W REFLEX MICROSCOPIC
Bacteria, UA: NONE SEEN
Bilirubin Urine: NEGATIVE
Glucose, UA: NEGATIVE mg/dL
Ketones, ur: NEGATIVE mg/dL
Leukocytes,Ua: NEGATIVE
Nitrite: NEGATIVE
Protein, ur: NEGATIVE mg/dL
Specific Gravity, Urine: 1.004 — ABNORMAL LOW (ref 1.005–1.030)
pH: 5 (ref 5.0–8.0)

## 2020-01-20 LAB — CBC
HCT: 30.6 % — ABNORMAL LOW (ref 36.0–46.0)
Hemoglobin: 10 g/dL — ABNORMAL LOW (ref 12.0–15.0)
MCH: 29.2 pg (ref 26.0–34.0)
MCHC: 32.7 g/dL (ref 30.0–36.0)
MCV: 89.5 fL (ref 80.0–100.0)
Platelets: 249 10*3/uL (ref 150–400)
RBC: 3.42 MIL/uL — ABNORMAL LOW (ref 3.87–5.11)
RDW: 13.2 % (ref 11.5–15.5)
WBC: 9 10*3/uL (ref 4.0–10.5)
nRBC: 0 % (ref 0.0–0.2)

## 2020-01-20 MED ORDER — PROMETHAZINE HCL 25 MG PO TABS
25.0000 mg | ORAL_TABLET | Freq: Once | ORAL | Status: AC
  Administered 2020-01-20: 25 mg via ORAL
  Filled 2020-01-20: qty 1

## 2020-01-20 MED ORDER — IBUPROFEN 600 MG PO TABS
600.0000 mg | ORAL_TABLET | Freq: Once | ORAL | Status: AC
  Administered 2020-01-20: 600 mg via ORAL
  Filled 2020-01-20: qty 1

## 2020-01-20 NOTE — MAU Note (Signed)
PT SAYS SHE HAD C/S ON 10-21- BY DR MODY.     SAYS NAUSEA STARTED ON MON - WED WORSE. YESTERDAY HAD CHILLS - NO FEVER AT HOME . HAS H/A - BUT TYLENOL AT 630 FOR PAIN- DID NOT HELP H/A. SAYS CHEST FEELS HEAVY X2 DAYS -   CALLED  MEREDITH, CNM TONIGHT .   BP AT HOME- 135/90.

## 2020-01-20 NOTE — MAU Provider Note (Signed)
History     CSN: [id number]  Arrival date and time: 01/20/20 2027   None     Chief Complaint  Patient presents with  . Chills   HPI  G1P1 who had pLTCS for failed induction on 10/21 who presenting for chills, headache, high blood pressure, heavy chest.   Patient reports feeling fatigued and having a bit of nausea since her c section. She has had some chills at home but has taken her temperature and has not had a fever. She has not had any vomiting, diarrhea, or increased abdominal pain. She reports no issues with her incision site. She is drinking lots of fluids and gatorade. Today she had a j[employer] sandwich, some of a quesadilla, and a cliff bar, which is less than her usual intake. She describes that she notices like she has to take a deep breath when she is going up a flight of stairs, but otherwise denies chest heaviness. Denies chest pain or difficulty breathing.   She also has had a headache since yesterday. It has improved with tylenol but is still present. No vision changes. She took her BP at home and it was 135/90. She denies having blood pressure issues during pregnancy.   She reports no urinary symptoms. Feels like she is peeing normally. Has noticed blood when she urinates but believes this is vaginal.    OB History    Gravida  1   Para  1   Term  1   Preterm  0   AB  0   Living  1     SAB  0   TAB  0   Ectopic  0   Multiple  0   Live Births  1           Past Medical History:  Diagnosis Date  . Elevated cholesterol 03/06/2017    Past Surgical History:  Procedure Laterality Date  . CESAREAN SECTION N/A 01/12/2020   Procedure: CESAREAN SECTION;  Surgeon: Shea Evans, MD;  Location: [facility] LD ORS;  Service: Obstetrics;  Laterality: N/A;  . NO PAST SURGERIES      Family History  Problem Relation Age of Onset  . Thyroid disease Mother   . Thyroid disease Father   . Diabetes Maternal Uncle   . Hypercholesterolemia Maternal Grandmother    . Hypercholesterolemia Maternal Grandfather   . Breast cancer Neg Hx   . Colon cancer Neg Hx     Social History   Tobacco Use  . Smoking status: Never Smoker  . Smokeless tobacco: Never Used  Vaping Use  . Vaping Use: Never used  Substance Use Topics  . Alcohol use: Yes    Comment: socially "barely any"  . Drug use: Never    Allergies: No Known Allergies  Medications Prior to Admission  Medication Sig Dispense Refill Last Dose  . acetaminophen (TYLENOL) 325 MG tablet Take 2 tablets (650 mg total) by mouth every 4 (four) hours as needed for mild pain (temperature > 101.5.). 30 tablet 1 01/20/2020 at Unknown time  . ibuprofen (ADVIL) 800 MG tablet Take 1 tablet (800 mg total) by mouth every 6 (six) hours. 30 tablet 0 01/20/2020 at Unknown time  . Prenatal MV-Min-Fe Fum-FA-DHA (PRENATAL 1 PO) Take 1 tablet by mouth daily.   01/18/2020 at Unknown time  . coconut oil OIL Apply 1 application topically as needed.  0   . oxyCODONE (OXY IR/ROXICODONE) 5 MG immediate release tablet Take 1-2 tablets (5-10 mg total) by mouth  every 4 (four) hours as needed for moderate pain. 30 tablet 0 01/17/2020    Review of Systems  Constitutional: Positive for chills and fatigue. Negative for diaphoresis and fever.  Respiratory: Negative for chest tightness and shortness of breath.   Cardiovascular: Negative for chest pain.  Gastrointestinal: Positive for nausea. Negative for abdominal pain, diarrhea and vomiting.  Genitourinary: Negative for difficulty urinating and dysuria.  Musculoskeletal: Negative for back pain.  Neurological: Positive for headaches. Negative for dizziness.   Physical Exam   Blood pressure 122/70, pulse (!) 58, temperature 99.3 F (37.4 C), temperature source Oral, resp. rate 20, height 5\' 5"  (1.651 m), weight 116.3 kg, unknown if currently breastfeeding.  Physical Exam Vitals and nursing note reviewed.  Constitutional:      Appearance: Normal appearance.   Cardiovascular:     Rate and Rhythm: Normal rate and regular rhythm.  Pulmonary:     Effort: Pulmonary effort is normal.     Breath sounds: Normal breath sounds.  Abdominal:     General: Bowel sounds are normal.     Palpations: Abdomen is soft.     Tenderness: There is no abdominal tenderness.  Skin:    Comments: Incision is well approximated and healing well, no surrounding erythema. No drainage. Steri strips in place.   Neurological:     General: No focal deficit present.     Mental Status: She is alert.  Psychiatric:        Mood and Affect: Mood normal.        Behavior: Behavior normal.     MAU Course  Procedures  MDM Pt evaluated at bedside PEC labs, urine p:C obtained Phenergan, ibuprofen PO  Labs demonstrate Creatinine 2.17. AST/ALT normal. Plts 249.  UA obtained via straight cath. UPC repeated on this sample.  BP 141/72, 135/82, 122/70 UA neg nitrites, neg Leukocytes, neg RBC. UPC unremarkable.  Discussed case with Dr. , plan for observation. Insert pIV and give 1L LR.   Assessment and Plan   AKI, Headache -unclear etiology, possibly prerenal 2/2 dehydration, less likely PEC given overall normal BP, no proteinuria. Less likely infectious given unremarkable UA, no leukocytosis on CBC -plan for observation with IVF, monitor BP, repeat labs in AM.  -admission orders placed    01/21/2020, 12:36 AM   01/23/2020, MD [facility] Family Medicine Fellow, [facility] for [facility], [facility]

## 2020-01-21 ENCOUNTER — Observation Stay (HOSPITAL_COMMUNITY): Payer: BC Managed Care – PPO

## 2020-01-21 DIAGNOSIS — R030 Elevated blood-pressure reading, without diagnosis of hypertension: Secondary | ICD-10-CM | POA: Diagnosis present

## 2020-01-21 DIAGNOSIS — N179 Acute kidney failure, unspecified: Secondary | ICD-10-CM | POA: Diagnosis present

## 2020-01-21 DIAGNOSIS — O904 Postpartum acute kidney failure: Secondary | ICD-10-CM | POA: Diagnosis present

## 2020-01-21 DIAGNOSIS — O1415 Severe pre-eclampsia, complicating the puerperium: Secondary | ICD-10-CM | POA: Diagnosis not present

## 2020-01-21 DIAGNOSIS — Z20822 Contact with and (suspected) exposure to covid-19: Secondary | ICD-10-CM | POA: Diagnosis present

## 2020-01-21 DIAGNOSIS — O99893 Other specified diseases and conditions complicating puerperium: Secondary | ICD-10-CM | POA: Diagnosis present

## 2020-01-21 DIAGNOSIS — J810 Acute pulmonary edema: Secondary | ICD-10-CM | POA: Diagnosis present

## 2020-01-21 DIAGNOSIS — O1414 Severe pre-eclampsia complicating childbirth: Secondary | ICD-10-CM | POA: Diagnosis present

## 2020-01-21 LAB — COMPREHENSIVE METABOLIC PANEL
ALT: 12 U/L (ref 0–44)
AST: 14 U/L — ABNORMAL LOW (ref 15–41)
Albumin: 2.6 g/dL — ABNORMAL LOW (ref 3.5–5.0)
Alkaline Phosphatase: 64 U/L (ref 38–126)
Anion gap: 11 (ref 5–15)
BUN: 21 mg/dL — ABNORMAL HIGH (ref 6–20)
CO2: 21 mmol/L — ABNORMAL LOW (ref 22–32)
Calcium: 8.6 mg/dL — ABNORMAL LOW (ref 8.9–10.3)
Chloride: 111 mmol/L (ref 98–111)
Creatinine, Ser: 1.88 mg/dL — ABNORMAL HIGH (ref 0.44–1.00)
GFR, Estimated: 37 mL/min — ABNORMAL LOW (ref >60)
Glucose, Bld: 81 mg/dL (ref 70–99)
Potassium: 3.7 mmol/L (ref 3.5–5.1)
Sodium: 143 mmol/L (ref 135–145)
Total Bilirubin: 0.5 mg/dL (ref 0.3–1.2)
Total Protein: 5.6 g/dL — ABNORMAL LOW (ref 6.5–8.1)

## 2020-01-21 LAB — RESPIRATORY PANEL BY RT PCR (FLU A&B, COVID)
Influenza A by PCR: NEGATIVE
Influenza B by PCR: NEGATIVE
SARS Coronavirus 2 by RT PCR: NEGATIVE

## 2020-01-21 MED ORDER — ACETAMINOPHEN 325 MG PO TABS: 650.0000 mg | ORAL_TABLET | ORAL | Status: DC | PRN

## 2020-01-21 MED ORDER — MAGNESIUM SULFATE 40 GM/1000ML IV SOLN
2.0000 g/h | INTRAVENOUS | Status: DC
  Administered 2020-01-21 – 2020-01-22 (×2): 2 g/h via INTRAVENOUS
  Filled 2020-01-21 (×2): qty 1000

## 2020-01-21 MED ORDER — OXYCODONE HCL 5 MG PO TABS
5.0000 mg | ORAL_TABLET | Freq: Four times a day (QID) | ORAL | Status: DC | PRN
  Administered 2020-01-21 – 2020-01-23 (×2): 5 mg via ORAL
  Filled 2020-01-21 (×2): qty 1

## 2020-01-21 MED ORDER — DOCUSATE SODIUM 100 MG PO CAPS
100.0000 mg | ORAL_CAPSULE | Freq: Every day | ORAL | Status: DC
  Administered 2020-01-22 – 2020-01-23 (×2): 100 mg via ORAL
  Filled 2020-01-21 (×2): qty 1

## 2020-01-21 MED ORDER — LACTATED RINGERS IV SOLN: INTRAVENOUS | Status: DC

## 2020-01-21 MED ORDER — LABETALOL HCL 5 MG/ML IV SOLN: 80.0000 mg | INTRAVENOUS | Status: DC | PRN

## 2020-01-21 MED ORDER — LABETALOL HCL 5 MG/ML IV SOLN
20.0000 mg | INTRAVENOUS | Status: DC | PRN
  Administered 2020-01-21: 20 mg via INTRAVENOUS
  Filled 2020-01-21: qty 4

## 2020-01-21 MED ORDER — FUROSEMIDE 10 MG/ML IJ SOLN
10.0000 mg | Freq: Once | INTRAMUSCULAR | Status: AC
  Administered 2020-01-21: 10 mg via INTRAVENOUS
  Filled 2020-01-21: qty 2

## 2020-01-21 MED ORDER — LABETALOL HCL 5 MG/ML IV SOLN
40.0000 mg | INTRAVENOUS | Status: DC | PRN
  Administered 2020-01-21: 40 mg via INTRAVENOUS
  Filled 2020-01-21: qty 8

## 2020-01-21 MED ORDER — ACETAMINOPHEN 325 MG PO TABS
650.0000 mg | ORAL_TABLET | ORAL | Status: DC | PRN
  Administered 2020-01-21: 650 mg via ORAL
  Filled 2020-01-21: qty 2

## 2020-01-21 MED ORDER — LABETALOL HCL 100 MG PO TABS
100.0000 mg | ORAL_TABLET | Freq: Two times a day (BID) | ORAL | Status: DC
  Administered 2020-01-21 – 2020-01-23 (×4): 100 mg via ORAL
  Filled 2020-01-21 (×4): qty 1

## 2020-01-21 MED ORDER — PRENATAL MULTIVITAMIN CH: 1.0000 | ORAL_TABLET | Freq: Every day | ORAL | Status: DC

## 2020-01-21 MED ORDER — ENOXAPARIN SODIUM 40 MG/0.4ML ~~LOC~~ SOLN
40.0000 mg | SUBCUTANEOUS | Status: DC
  Administered 2020-01-21 – 2020-01-22 (×2): 40 mg via SUBCUTANEOUS
  Filled 2020-01-21 (×2): qty 0.4

## 2020-01-21 MED ORDER — CALCIUM CARBONATE ANTACID 500 MG PO CHEW: 2.0000 | CHEWABLE_TABLET | ORAL | Status: DC | PRN

## 2020-01-21 MED ORDER — ACETAMINOPHEN 325 MG PO TABS
650.0000 mg | ORAL_TABLET | Freq: Four times a day (QID) | ORAL | Status: DC | PRN
  Administered 2020-01-22 (×3): 650 mg via ORAL
  Filled 2020-01-21 (×3): qty 2

## 2020-01-21 MED ORDER — HYDRALAZINE HCL 20 MG/ML IJ SOLN: 10.0000 mg | INTRAMUSCULAR | Status: DC | PRN

## 2020-01-21 MED ORDER — NIFEDIPINE ER OSMOTIC RELEASE 30 MG PO TB24
30.0000 mg | ORAL_TABLET | Freq: Every day | ORAL | Status: DC
  Administered 2020-01-22 – 2020-01-23 (×2): 30 mg via ORAL
  Filled 2020-01-21 (×2): qty 1

## 2020-01-21 MED ORDER — PRENATAL MULTIVITAMIN CH
1.0000 | ORAL_TABLET | Freq: Every day | ORAL | Status: DC
  Administered 2020-01-22: 1 via ORAL
  Filled 2020-01-21: qty 1

## 2020-01-21 MED ORDER — LACTATED RINGERS IV BOLUS: 1000.0000 mL | Freq: Once | INTRAVENOUS | Status: DC

## 2020-01-21 NOTE — H&P (Signed)
29 yo, G1P1, is POD #8 s/p 1' LTCS for fetal intolerance to labor and arrest of dilatation. IOL for post-dates and BPP 6/8 but no preeclampsia or gestational HTN in pregnancy or during post-op/ postpartum stay.  She presents to MAU for not feeling well, reporting chills, headache, high blood pressure, heavy chest.   Patient reports feeling fatigued and having a bit of nausea since her c/section. She has had some chills but no fever.  Has nausea but no vomiting, diarrhea, or increased abdominal pain or problems with he incision. She is drinking lots of fluids and gatorade abd eating regular diet. She has to take a deep breath when she is going up a flight of stairs, but otherwise denies chest heaviness. Denies chest pain or difficulty breathing.   She also has had a headache since yesterday. It has improved with tylenol but is still present. No vision changes. She took her BP at home and it was 135/90. She denies having blood pressure issues during pregnancy.   She reports no urinary symptoms. Feels like she is peeing normally. Has noticed blood when she urinates but believes this is vaginal.    Review of Systems  Constitutional: Positive for chills and fatigue. Negative for diaphoresis and fever.  Respiratory: Negative for chest tightness and shortness of breath.   Cardiovascular: Negative for chest pain.  Gastrointestinal: Positive for nausea. Negative for abdominal pain, diarrhea and vomiting.  Genitourinary: Negative for difficulty urinating and dysuria.  Musculoskeletal: Negative for back pain.  Neurological: Positive for headaches. Negative for dizziness.   Physical Exam   BP at admission only one elevated but more elevated this morning confirming PEC with severe range BPs  BP (!) 155/93   Pulse 69   Temp (P) 98.5 F (36.9 C) (Oral)   Resp (P) 16   Ht 5\' 5"  (1.651 m)   Wt 116.3 kg   SpO2 95%   BMI 42.68 kg/m   Patient Vitals for the past 24 hrs:  BP Temp Temp src Pulse  Resp SpO2 Height Weight  01/21/20 1246 (!) 155/93 -- -- 69 -- 95 % -- --  01/21/20 1230 (!) 156/88 -- -- 71 -- -- -- --  01/21/20 1200 138/84 -- -- 70 -- 94 % -- --  01/21/20 1145 (!) 145/84 -- -- 66 -- 95 % -- --  01/21/20 1130 137/81 -- -- 69 -- 95 % -- --  01/21/20 1115 (!) 143/86 -- -- 64 -- 94 % -- --  01/21/20 1100 (!) 142/89 -- -- 75 -- 93 % -- --  01/21/20 1045 (!) 143/83 -- -- 67 -- 93 % -- --  01/21/20 1030 (!) 148/87 -- -- 64 -- 94 % -- --  01/21/20 1015 (!) 146/87 -- -- (!) 57 -- 97 % -- --  01/21/20 1013 (!) 145/78 (P) 98.5 F (36.9 C) (P) Oral (!) 59 (P) 16 96 % -- --  01/21/20 0933 (!) 148/80 -- -- (!) 56 -- -- -- --  01/21/20 0900 (!) 162/103 -- -- (!) 56 -- 95 % -- --  01/21/20 0839 (!) 168/94 -- -- (!) 54 -- -- -- --  01/21/20 0823 (!) 165/101 -- -- (!) 53 17 98 % -- --  01/21/20 0127 -- -- -- 72 -- 97 % -- --  01/21/20 0031 119/66 -- -- 62 -- -- -- --  01/21/20 0016 115/73 -- -- 62 -- -- -- --  01/21/20 0001 128/71 -- -- (!) 59 -- -- -- --  01/20/20 2346 122/70 -- -- (!) 58 -- -- -- --  01/20/20 2331 135/82 -- -- (!) 55 -- -- -- --  01/20/20 2055 (!) 141/72 99.3 F (37.4 C) Oral 67 20 -- 5\' 5"  (1.651 m) 116.3 kg   Physical exam:  A&O x 3, no acute distress. Pleasant HEENT neg, no thyromegaly Lungs CTA bilat, no crackles CV RRR, S1S2 normal, low normal pulse  Abdo soft, non tender, non acute Extr no edema/ tenderness, DTR +2  Pelvic deferred  CBC Latest Ref Rng & Units 01/20/2020 01/13/2020 01/11/2020  WBC 4.0 - 10.5 K/uL 9.0 15.8(H) 11.6(H)  Hemoglobin 12.0 - 15.0 g/dL 10.0(L) 10.9(L) 13.4  Hematocrit 36 - 46 % 30.6(L) 33.9(L) 41.0  Platelets 150 - 400 K/uL 249 177 222   CMP Latest Ref Rng & Units 01/21/2020 01/20/2020 11/25/2018  Glucose 70 - 99 mg/dL 81 90 86  BUN 6 - 20 mg/dL 01/25/2019) 15(Q) 12  Creatinine 0.44 - 1.00 mg/dL 00(Q) 6.76(P) 9.50(D  Sodium 135 - 145 mmol/L 143 141 138  Potassium 3.5 - 5.1 mmol/L 3.7 3.7 4.0  Chloride 98 - 111 mmol/L 111 108  102  CO2 22 - 32 mmol/L 21(L) 21(L) 28  Calcium 8.9 - 10.3 mg/dL 3.26) 7.1(I) 9.1  Total Protein 6.5 - 8.1 g/dL 4.5(Y) 0.9(X) 6.7  Total Bilirubin 0.3 - 1.2 mg/dL 0.5 0.3 0.5  Alkaline Phos 38 - 126 U/L 64 80 52  AST 15 - 41 U/L 14(L) 14(L) 33  ALT 0 - 44 U/L 12 12 33   A/P: PPD # 9, presented with vague symptoms last night, only abnormal finding of elevated creatinine at 2.17 with normal urine p/c ratio (cath specimen) but now elevated BPs in severe range with defnite diagnosis of severe postpartum Preeclampsia with renal affection and CXR c/w early pulmonary edema  1) PP PEC- magnesium 2 gm /hr without bolus for seizure prophylaxis, strict I/O and mag checks Labetalol 20mg  IV and 40mg  IV. Adding PO Procardia 30 mg XL and Labetalol 200mg  bid Daily weights  2) Acute renal insufficiency- Creatinine better with hydration this morning, cont PO hydration (s/p 1 lit bolus last night), repeat BMP tomo, since LFTs nl. NO Ibuprofen 3) SOB/ CP/ Pulm edema- low dose Lasix at 10 mg only since elevated creatinine, watch I/O and O2 sats 4) Add echo if s/s dont improve by tomorrow  5) Post-op and PP- pain mgnt with only Tylenol and Oxycodone, no Ibuprofen. Pumping in room, baby with in-laws  Findings and mngmt d/w pt and husband, they voice understanding Anticipate admission for 1-3 days   8.3(J Mililani Murthy MD

## 2020-01-22 LAB — COMPREHENSIVE METABOLIC PANEL
ALT: 15 U/L (ref 0–44)
AST: 18 U/L (ref 15–41)
Albumin: 2.8 g/dL — ABNORMAL LOW (ref 3.5–5.0)
Alkaline Phosphatase: 73 U/L (ref 38–126)
Anion gap: 13 (ref 5–15)
BUN: 14 mg/dL (ref 6–20)
CO2: 23 mmol/L (ref 22–32)
Calcium: 8.2 mg/dL — ABNORMAL LOW (ref 8.9–10.3)
Chloride: 100 mmol/L (ref 98–111)
Creatinine, Ser: 1.76 mg/dL — ABNORMAL HIGH (ref 0.44–1.00)
GFR, Estimated: 40 mL/min — ABNORMAL LOW (ref >60)
Glucose, Bld: 143 mg/dL — ABNORMAL HIGH (ref 70–99)
Potassium: 3.3 mmol/L — ABNORMAL LOW (ref 3.5–5.1)
Sodium: 136 mmol/L (ref 135–145)
Total Bilirubin: 0.4 mg/dL (ref 0.3–1.2)
Total Protein: 6.1 g/dL — ABNORMAL LOW (ref 6.5–8.1)

## 2020-01-22 LAB — CBC
HCT: 31 % — ABNORMAL LOW (ref 36.0–46.0)
Hemoglobin: 10.3 g/dL — ABNORMAL LOW (ref 12.0–15.0)
MCH: 29.3 pg (ref 26.0–34.0)
MCHC: 33.2 g/dL (ref 30.0–36.0)
MCV: 88.3 fL (ref 80.0–100.0)
Platelets: 267 10*3/uL (ref 150–400)
RBC: 3.51 MIL/uL — ABNORMAL LOW (ref 3.87–5.11)
RDW: 13.2 % (ref 11.5–15.5)
WBC: 11 10*3/uL — ABNORMAL HIGH (ref 4.0–10.5)
nRBC: 0 % (ref 0.0–0.2)

## 2020-01-22 LAB — MAGNESIUM
Magnesium: 8 mg/dL (ref 1.7–2.4)
Magnesium: 6.4 mg/dL (ref 1.7–2.4)

## 2020-01-22 MED ORDER — POTASSIUM CHLORIDE 20 MEQ PO PACK: 20.0000 meq | PACK | Freq: Two times a day (BID) | ORAL | Status: DC

## 2020-01-22 MED ORDER — POTASSIUM CHLORIDE 20 MEQ PO PACK
20.0000 meq | PACK | Freq: Two times a day (BID) | ORAL | Status: AC
  Administered 2020-01-22 – 2020-01-23 (×2): 20 meq via ORAL
  Filled 2020-01-22 (×2): qty 1

## 2020-01-22 NOTE — Progress Notes (Signed)
Vanessa Keith, Postpartum readmit for severe postpartum preeclampsia with acute renal insufficiency Early slight pulmonary edema on CXR, s/p 10 mg Lasix (low dose AKI)  Subjective: Feels much better. Some mental fogginess but improving since magnesium turned off this morning at 8 am (almost 24hrs).  No HA/ SOB but noted chest pressure when she was lying down earlier this morning, is better now. LE swelling down   Objective: BP 132/71 (BP Location: Right Arm)   Pulse 90   Temp 98.1 F (36.7 C) (Oral)   Resp 18   Ht 5\' 5"  (1.651 m)   Wt 112.6 kg   SpO2 95%   BMI 41.31 kg/m   Vital signs in last 24 hours: Temp:  [98.1 F (36.7 C)-98.5 F (36.9 C)] 98.1 F (36.7 C) (10/31 1127) Pulse Rate:  [72-90] 90 (10/31 1127) Resp:  [16-18] 18 (10/31 1127) BP: (123-139)/(69-99) 132/71 (10/31 1127) SpO2:  [92 %-96 %] 95 % (10/31 1127) Weight:  [112.6 kg] 112.6 kg (10/31 1148) Weight change:  - 4 Kg (down from 116 kg at admission 10/30 AM)     Patient Vitals for the past 24 hrs:  BP Temp Temp src Pulse Resp SpO2 Weight  01/22/20 1148 -- -- -- -- -- -- 112.6 kg  01/22/20 1127 132/71 98.1 F (36.7 C) Oral 90 18 95 % --  01/22/20 0755 127/69 98.1 F (36.7 C) Oral 79 18 -- --  01/22/20 0754 -- -- -- -- -- 96 % --  01/22/20 0649 -- -- -- -- 18 -- --  01/22/20 0600 -- -- -- -- 16 -- --  01/22/20 0500 -- -- -- -- 18 -- --  01/22/20 0415 139/79 98.5 F (36.9 C) Oral 86 18 95 % --  01/22/20 0100 -- -- -- -- 16 92 % --  01/21/20 2355 133/71 98.3 F (36.8 C) Oral 89 16 96 % --  01/21/20 2310 -- -- -- -- -- 94 % --  01/21/20 2305 -- -- -- -- -- 95 % --  01/21/20 2300 -- -- -- -- 18 95 % --  01/21/20 2200 -- -- -- -- 16 -- --  01/21/20 2158 -- -- -- 84 -- -- --  01/21/20 2100 -- -- -- -- 18 -- --  01/21/20 1950 135/77 98.1 F (36.7 C) Oral 79 18 94 % --  01/21/20 1700 (!) 123/99 -- -- 81 -- -- --  01/21/20 1600 133/80 -- -- 72 -- 95 % --  01/21/20 1500 139/73 -- -- 84 -- -- --  01/21/20 1430  132/70 -- -- 72 -- -- --   Intake/Output from previous day: 10/30 0701 - 10/31 0700 In: 7280.4 [P.O.:4770; I.V.:2510.4] Out: 5700 [Urine:5700] Intake/Output this shift: Total I/O In: -  Out: 1000 [Urine:1000]   Physical exam:  A&O x 3, no acute distress. Pleasant HEENT neg Lungs CTA bilat CV RRR, S1S2 normal Abdo soft, non tender, non acute.Uterus well involuted  Incision healing well, steristrips in place  Extr no edema (improved a lot)/ tenderness. DTR +1 bilat. Homan's neg  Pelvic def, not much lochia   Lab Results: Recent Labs    01/20/20 2140 01/22/20 0413  WBC 9.0 11.0*  HGB 10.0* 10.3*  HCT 30.6* 31.0*  PLT 249 267   CMP Latest Ref Rng & Units 01/22/2020 01/21/2020 01/20/2020  Glucose 70 - 99 mg/dL 01/22/2020) 81 90  BUN 6 - 20 mg/dL 14 010(U) 72(Z)  Creatinine 0.44 - 1.00 mg/dL 36(U) 4.40(H) 4.74(Q)  Sodium 135 - 145  mmol/L 136 143 141  Potassium 3.5 - 5.1 mmol/L 3.3(L) 3.7 3.7  Chloride 98 - 111 mmol/L 100 111 108  CO2 22 - 32 mmol/L 23 21(L) 21(L)  Calcium 8.9 - 10.3 mg/dL 8.2(L) 8.6(L) 8.6(L)  Total Protein 6.5 - 8.1 g/dL 6.1(L) 5.6(L) 5.5(L)  Total Bilirubin 0.3 - 1.2 mg/dL 0.4 0.5 0.3  Alkaline Phos 38 - 126 U/L 73 64 80  AST 15 - 41 U/L 18 14(L) 14(L)  ALT 0 - 44 U/L 15 12 12     Studies/Results: DG Chest 2 View  Result Date: 01/21/2020 CLINICAL DATA:  Chest heaviness for 2 days.  Nausea. EXAM: CHEST - 2 VIEW COMPARISON:  None. FINDINGS: Cardiac silhouette normal in size. No mediastinal or hilar masses or evidence of adenopathy. Bilateral interstitial thickening. Small focus of opacity at the posterolateral right lung base, likely atelectasis. Remainder of the lungs is clear. No pleural effusion and no pneumothorax. Skeletal structures are unremarkable. IMPRESSION: 1. Mild interstitial thickening, which may reflect edema or infection/inflammation. Small focus of opacity at the posterolateral right lung base is likely atelectasis although infection is  possible. 2. No other abnormalities. Electronically Signed   By: 01/23/2020 M.D.   On: 01/21/2020 09:57    Medications:  Scheduled: . docusate sodium  100 mg Oral Daily  . enoxaparin (LOVENOX) injection  40 mg Subcutaneous Q24H  . labetalol  100 mg Oral BID  . NIFEdipine  30 mg Oral Daily  . prenatal multivitamin  1 tablet Oral Q1200    Assessment/Plan: Vanessa Keith, C/s POD #10, readmit Vanessa Keith for postpartum severe preeclampsia with acute renal insufficiency.  1) Severe PEC- BP and symptoms better.      Magnesium turned off at 8 am at almost 24hrs due to mag level at 8     Repeat Mag level now better at 6.4 hrs (at 5 hrs after stopping), no s/s of toxicity     Procardia XL 30mg  and Labetalol 100mg  bid      Excellent Diuressi 2) AKI - improved with hydration and BP control. Creatinine down from 2.17 to 1.76 this morning, continue PO hydration and BP control.  STOP Ibuprofen. Was taking a lot along with TYlenol since she was trying to avoid Oxycodone as it didn't feel good. Will need to take Tylenol and add Oxycodone prn but dont take Ibuprofen 3) Pulm edema- resolvedwith excellent diuresis and BP control. O2 sats better, no indication for EKG or echo at present  4) Post-op stable, cont PP care incl breast pumping   May discharge later today since BP better and Mag level better at 1 pm lab.  F/up in office on 11/3 with MD assessment/ BP check/ BMP    LOS: 1 day   01/22/2020, 1:36 PM

## 2020-01-22 NOTE — Progress Notes (Signed)
Patient ID: [name], female   DOB: [birth year], 29 y.o.   MRN: [id number]   Pt reports feeling better, BP stable after some ambulation.  BP 125/60 (BP Location: Right Arm)   Pulse 84   Temp 98.2 F (36.8 C) (Oral)   Resp 18   Ht 5\' 5"  (1.651 m)   Wt 112.6 kg   SpO2 95%   BMI 41.31 kg/m   Meds Scheduled Meds: . docusate sodium  100 mg Oral Daily  . enoxaparin (LOVENOX) injection  40 mg Subcutaneous Q24H  . labetalol  100 mg Oral BID  . NIFEdipine  30 mg Oral Daily  . potassium chloride  20 mEq Oral BID  . prenatal multivitamin  1 tablet Oral Q1200   Continuous Infusions: . lactated ringers    . lactated ringers Stopped (01/22/20 0814)  . magnesium sulfate Stopped (01/22/20 0814)   PRN Meds:.acetaminophen, calcium carbonate, labetalol **AND** labetalol **AND** labetalol **AND** hydrALAZINE **AND** Measure blood pressure, oxyCODONE  Will continue same antiHTN meds Pain meds- Tylenol and  Replace potassium with KCL 01/24/20 now and in AM.   Dr took over call and informed of this patient in details.  BMP in AM She'll assess her in AM and plan discharge.   -V.Valma Rotenberg MD

## 2020-01-22 NOTE — Progress Notes (Addendum)
Results for AREYANA, LEONI (MRN [id number]) as of 01/22/2020 08:13  Ref. Range 01/22/2020 04:13  Magnesium Latest Ref Range: 1.7 - 2.4 mg/dL 8.0 (HH)    Dr. Juliene Pina notified of critical magnesium level of 8.0. Magnesium turned off per Dr. Camillia Herter instruction. Carmelina Dane, RN

## 2020-01-23 LAB — BASIC METABOLIC PANEL
Anion gap: 10 (ref 5–15)
BUN: 15 mg/dL (ref 6–20)
CO2: 26 mmol/L (ref 22–32)
Calcium: 8.1 mg/dL — ABNORMAL LOW (ref 8.9–10.3)
Chloride: 104 mmol/L (ref 98–111)
Creatinine, Ser: 1.71 mg/dL — ABNORMAL HIGH (ref 0.44–1.00)
GFR, Estimated: 41 mL/min — ABNORMAL LOW (ref >60)
Glucose, Bld: 96 mg/dL (ref 70–99)
Potassium: 3.3 mmol/L — ABNORMAL LOW (ref 3.5–5.1)
Sodium: 140 mmol/L (ref 135–145)

## 2020-01-23 MED ORDER — MAGNESIUM SULFATE 40 GM/1000ML IV SOLN: 2.0000 g/h | INTRAVENOUS | Status: DC

## 2020-01-23 MED ORDER — LABETALOL HCL 100 MG PO TABS
100.0000 mg | ORAL_TABLET | Freq: Two times a day (BID) | ORAL | 1 refills | Status: DC
Start: 2020-01-23 — End: 2020-06-22

## 2020-01-23 MED ORDER — NIFEDIPINE ER 30 MG PO TB24
30.0000 mg | ORAL_TABLET | Freq: Every day | ORAL | 1 refills | Status: DC
Start: 2020-01-24 — End: 2021-06-27

## 2020-01-23 NOTE — Progress Notes (Signed)
HD 3 Postpartum readmit for severe postpartum preeclampsia with acute renal insufficiency Early slight pulmonary edema on CXR, s/p 10 mg Lasix (low dose AKI)  C/o very mild h/a, has taken tylenol but slightly there- frontal; no vision changes; tol po, voiding w/o difficulty; mild abdominal pain, controlled well with tylenol; normal lochia No sob/cp  Patient Vitals for the past 24 hrs:  BP Temp Temp src Pulse Resp SpO2 Weight  01/23/20 0620 121/73 98.8 F (37.1 C) Oral 78 17 96 % --  01/23/20 0204 111/68 98.5 F (36.9 C) Oral 86 16 98 % --  01/22/20 2202 124/72 -- -- 95 17 95 % --  01/22/20 1811 116/75 98.5 F (36.9 C) Oral 84 16 97 % --  01/22/20 1510 125/60 98.2 F (36.8 C) Oral 84 18 95 % --  01/22/20 1509 -- -- -- -- -- 95 % --  01/22/20 1148 -- -- -- -- -- -- 112.6 kg  01/22/20 1127 132/71 98.1 F (36.7 C) Oral 90 18 95 % --    No intake or output data in the 24 hours ending 01/23/20 1143 A&ox3 rrr ctab Abd: soft, nt, nd; +BS; incision: no e/d/i, some steristrips in place LE: trace to +1  Results for orders placed or performed during the hospital encounter of 01/20/20 (from the past 24 hour(s))  Magnesium     Status: Abnormal   Collection Time: 01/22/20 12:47 PM  Result Value Ref Range   Magnesium 6.4 (HH) 1.7 - 2.4 mg/dL  Basic metabolic panel     Status: Abnormal   Collection Time: 01/23/20  4:25 AM  Result Value Ref Range   Sodium 140 135 - 145 mmol/L   Potassium 3.3 (L) 3.5 - 5.1 mmol/L   Chloride 104 98 - 111 mmol/L   CO2 26 22 - 32 mmol/L   Glucose, Bld 96 70 - 99 mg/dL   BUN 15 6 - 20 mg/dL   Creatinine, Ser 8.67 (H) 0.44 - 1.00 mg/dL   Calcium 8.1 (L) 8.9 - 10.3 mg/dL   GFR, Estimated 41 (L) >60 mL/min   Anion gap 10 5 - 15   CBC Latest Ref Rng & Units 01/22/2020 01/20/2020 01/13/2020  WBC 4.0 - 10.5 K/uL 11.0(H) 9.0 15.8(H)  Hemoglobin 12.0 - 15.0 g/dL 10.3(L) 10.0(L) 10.9(L)  Hematocrit 36 - 46 % 31.0(L) 30.6(L) 33.9(L)  Platelets 150 - 400 K/uL 267  249 177   CMP Latest Ref Rng & Units 01/23/2020 01/22/2020 01/21/2020  Glucose 70 - 99 mg/dL 96 619(J) 81  BUN 6 - 20 mg/dL 15 14 09(T)  Creatinine 0.44 - 1.00 mg/dL 2.67(T) 2.45(Y) 0.99(I)  Sodium 135 - 145 mmol/L 140 136 143  Potassium 3.5 - 5.1 mmol/L 3.3(L) 3.3(L) 3.7  Chloride 98 - 111 mmol/L 104 100 111  CO2 22 - 32 mmol/L 26 23 21(L)  Calcium 8.9 - 10.3 mg/dL 8.1(L) 8.2(L) 8.6(L)  Total Protein 6.5 - 8.1 g/dL - 6.1(L) 5.6(L)  Total Bilirubin 0.3 - 1.2 mg/dL - 0.4 0.5  Alkaline Phos 38 - 126 U/L - 73 64  AST 15 - 41 U/L - 18 14(L)  ALT 0 - 44 U/L - 15 12   A/P: hd 3, pp readmit for pp severe pre-eclampsia with ARI; pod 11 s/p 1ltcs 1. Severe pre-e: s/p magsulfate, nearly 24 hr but stopped as magnesium level 8; pt never had s/s toxicity; has continued to have excellent diuresis (reported per pt but not recorded); contin procardia xl30mg  q d and labetalol 100mg   po bid;down another 2kg from yesterday ; plan d/c home today and f/u in office for repeat bmp/bp check in 2 days  2. Acute renal insufficienty: creatinine now 1.71 from 1.76 yesterday (2.17 on admission), continue good hydration and bp management 3. Pulmonary edema - resolved, continued excellent diuresis 4. Post op - doing well, pain controlled 5. H/a - very mild, has been taking tylenol, since has had a lot of ibuprofen, will plan oxycodone x1 and anticipate h/a to resolve, encourage frequent protein and good hydration

## 2020-01-25 DIAGNOSIS — R7989 Other specified abnormal findings of blood chemistry: Secondary | ICD-10-CM | POA: Diagnosis not present

## 2020-01-25 DIAGNOSIS — O1415 Severe pre-eclampsia, complicating the puerperium: Secondary | ICD-10-CM | POA: Diagnosis not present

## 2020-01-27 DIAGNOSIS — R7989 Other specified abnormal findings of blood chemistry: Secondary | ICD-10-CM | POA: Diagnosis not present

## 2020-01-30 NOTE — Discharge Summary (Signed)
Physician Discharge Summary  Patient ID: Vanessa Keith MRN: [id number] DOB/AGE: 08/14/1990 29 y.o.  Admit date: 01/20/2020 Discharge date: 01/30/2020  Admission Diagnoses: post partum severe pre-eclampsia  Discharge Diagnoses:  Principal Problem:   AKI (acute kidney injury) (HCC) Active Problems:   Acute kidney injury (HCC)   Hypertension in pregnancy, preeclampsia, severe, delivered   Pre-eclampsia, severe, postpartum condition   Discharged Condition: good  Hospital Course: The patient was readmitted pod 8 s/p 1ltcs (for fetal intolerance to labor/arrest of dilation) with diagnosis of severe pre-eclampsia, acute kidney injury with creatinine 2.17 and early pulmonary edema.  The patient was given magnesium sulfate, lasix to help manage fluid overload and begun on labetalol and procardia to manage bps.  The patient responded well to treatment, with excellent diuresis and improvement/normalizing creatine, bps were controlled well.  The patient is discharged on hd 3 and will be sent home on current bp meds.  Consults: None  Significant Diagnostic Studies: CXR  Treatments: diuretic, magnesium sulfate, procardia, labetalol  Discharge Exam: Blood pressure 132/67, pulse 78, temperature 98.6 F (37 C), temperature source Oral, resp. rate 18, height 5\' 5"  (1.651 m), weight 110.7 kg, SpO2 95 %, unknown if currently breastfeeding. See note  Disposition: Discharge disposition: 01-Home or Self Care       Discharge Instructions    Call MD for:  difficulty breathing, headache or visual disturbances   Complete by: As directed    Call MD for:  extreme fatigue   Complete by: As directed    Call MD for:  hives   Complete by: As directed    Call MD for:  persistant dizziness or light-headedness   Complete by: As directed    Call MD for:  persistant nausea and vomiting   Complete by: As directed    Call MD for:  redness, tenderness, or signs of infection (pain, swelling, redness, odor  or green/yellow discharge around incision site)   Complete by: As directed    Call MD for:  severe uncontrolled pain   Complete by: As directed    Call MD for:  temperature >100.4   Complete by: As directed    Diet - low sodium heart healthy   Complete by: As directed    Increase activity slowly   Complete by: As directed    Other Restrictions   Complete by: As directed    No lifting over 15-20 lbs Pelvic rest x6 wks     Allergies as of 01/23/2020   No Known Allergies     Medication List    STOP taking these medications   ibuprofen 800 MG tablet Commonly known as: ADVIL     TAKE these medications   acetaminophen 325 MG tablet Commonly known as: TYLENOL Take 2 tablets (650 mg total) by mouth every 4 (four) hours as needed for mild pain (temperature > 101.5.).   coconut oil Oil Apply 1 application topically as needed.   labetalol 100 MG tablet Commonly known as: NORMODYNE Take 1 tablet (100 mg total) by mouth 2 (two) times daily.   NIFEdipine 30 MG 24 hr tablet Commonly known as: ADALAT CC Take 1 tablet (30 mg total) by mouth daily.   oxyCODONE 5 MG immediate release tablet Commonly known as: Oxy IR/ROXICODONE Take 1-2 tablets (5-10 mg total) by mouth every 4 (four) hours as needed for moderate pain.   PRENATAL 1 PO Take 1 tablet by mouth daily.       Follow-up Information    13/03/2019,  MD Follow up.   Specialty: Obstetrics and Gynecology Contact information: [address] [place] [address] [phone]              Pt is to f/u in 2 days for bp check and repeat creatinine. Signed: Vick Frees 01/30/2020, 8:43 AM

## 2020-01-31 DIAGNOSIS — O1415 Severe pre-eclampsia, complicating the puerperium: Secondary | ICD-10-CM | POA: Diagnosis not present

## 2020-01-31 DIAGNOSIS — R7989 Other specified abnormal findings of blood chemistry: Secondary | ICD-10-CM | POA: Diagnosis not present

## 2020-02-07 DIAGNOSIS — R7989 Other specified abnormal findings of blood chemistry: Secondary | ICD-10-CM | POA: Diagnosis not present

## 2020-02-20 DIAGNOSIS — R7989 Other specified abnormal findings of blood chemistry: Secondary | ICD-10-CM | POA: Diagnosis not present

## 2020-03-30 DIAGNOSIS — Z1329 Encounter for screening for other suspected endocrine disorder: Secondary | ICD-10-CM | POA: Diagnosis not present

## 2020-03-30 DIAGNOSIS — Z6837 Body mass index (BMI) 37.0-37.9, adult: Secondary | ICD-10-CM | POA: Diagnosis not present

## 2020-03-30 DIAGNOSIS — Z13220 Encounter for screening for lipoid disorders: Secondary | ICD-10-CM | POA: Diagnosis not present

## 2020-03-30 DIAGNOSIS — Z01419 Encounter for gynecological examination (general) (routine) without abnormal findings: Secondary | ICD-10-CM | POA: Diagnosis not present

## 2020-03-30 DIAGNOSIS — Z0000 Encounter for general adult medical examination without abnormal findings: Secondary | ICD-10-CM | POA: Diagnosis not present

## 2020-03-30 DIAGNOSIS — Z131 Encounter for screening for diabetes mellitus: Secondary | ICD-10-CM | POA: Diagnosis not present

## 2020-06-22 ENCOUNTER — Other Ambulatory Visit: Payer: Self-pay

## 2020-06-22 ENCOUNTER — Ambulatory Visit: Payer: BC Managed Care – PPO | Admitting: Physician Assistant

## 2020-06-22 ENCOUNTER — Encounter: Payer: Self-pay | Admitting: Physician Assistant

## 2020-06-22 VITALS — BP 120/88 | HR 90 | Temp 97.4°F | Ht 65.0 in | Wt 219.8 lb

## 2020-06-22 DIAGNOSIS — O1415 Severe pre-eclampsia, complicating the puerperium: Secondary | ICD-10-CM | POA: Diagnosis not present

## 2020-06-22 LAB — CBC WITH DIFFERENTIAL/PLATELET
Basophils Absolute: 0.1 10*3/uL (ref 0.0–0.1)
Basophils Relative: 0.8 % (ref 0.0–3.0)
Eosinophils Absolute: 0.2 10*3/uL (ref 0.0–0.7)
Eosinophils Relative: 1.8 % (ref 0.0–5.0)
HCT: 42.3 % (ref 36.0–46.0)
Hemoglobin: 14.1 g/dL (ref 12.0–15.0)
Lymphocytes Relative: 29.3 % (ref 12.0–46.0)
Lymphs Abs: 2.6 10*3/uL (ref 0.7–4.0)
MCHC: 33.4 g/dL (ref 30.0–36.0)
MCV: 84.6 fl (ref 78.0–100.0)
Monocytes Absolute: 0.5 10*3/uL (ref 0.1–1.0)
Monocytes Relative: 5.9 % (ref 3.0–12.0)
Neutro Abs: 5.4 10*3/uL (ref 1.4–7.7)
Neutrophils Relative %: 62.2 % (ref 43.0–77.0)
Platelets: 270 10*3/uL (ref 150.0–400.0)
RBC: 5.01 Mil/uL (ref 3.87–5.11)
RDW: 13.6 % (ref 11.5–15.5)
WBC: 8.7 10*3/uL (ref 4.0–10.5)

## 2020-06-22 LAB — COMPREHENSIVE METABOLIC PANEL
ALT: 16 U/L (ref 0–35)
AST: 14 U/L (ref 0–37)
Albumin: 4.4 g/dL (ref 3.5–5.2)
Alkaline Phosphatase: 83 U/L (ref 39–117)
BUN: 19 mg/dL (ref 6–23)
CO2: 29 mEq/L (ref 19–32)
Calcium: 9.5 mg/dL (ref 8.4–10.5)
Chloride: 103 mEq/L (ref 96–112)
Creatinine, Ser: 0.8 mg/dL (ref 0.40–1.20)
GFR: 99.8 mL/min (ref >60.00)
Glucose, Bld: 87 mg/dL (ref 70–99)
Potassium: 4 mEq/L (ref 3.5–5.1)
Sodium: 140 mEq/L (ref 135–145)
Total Bilirubin: 0.5 mg/dL (ref 0.2–1.2)
Total Protein: 7.5 g/dL (ref 6.0–8.3)

## 2020-06-22 NOTE — Progress Notes (Signed)
Vanessa Keith is a 30 y.o. female here for a follow up of a pre-existing problem.  History of Present Illness:   Chief Complaint  Patient presents with  . Follow-up    Was taking a blood pressure medication post pregnancy, ran out a week ago but has been keeping track of readings at home.    HPI  Post Partum HTN Currently taking no medication. She did not have BP concerns during pregnancy. Ended up going to hospital a week after hospitalization and dx with PP PEC. Was prescribed nifidepine 30 mg XR daily and labetolol 200 mg daily BID. She was taken off labetolol by her ob-gyn at follow-up and ran out of her nifidepene a week ago. At home blood pressure readings are: 90-120/60-80. Patient denies chest pain, SOB, blurred vision, dizziness, unusual headaches, lower leg swelling.  Denies excessive caffeine intake, stimulant usage, excessive alcohol intake, or increase in salt consumption.  BP Readings from Last 3 Encounters:  06/22/20 120/88  01/23/20 132/67  01/15/20 119/62     Wt Readings from Last 5 Encounters:  06/22/20 219 lb 12.8 oz (99.7 kg)  01/23/20 244 lb (110.7 kg)  01/11/20 253 lb 4.8 oz (114.9 kg)  11/25/18 204 lb (92.5 kg)  03/12/18 198 lb (89.8 kg)     Past Medical History:  Diagnosis Date  . Elevated cholesterol 03/06/2017     Social History   Tobacco Use  . Smoking status: Never Smoker  . Smokeless tobacco: Never Used  Vaping Use  . Vaping Use: Never used  Substance Use Topics  . Alcohol use: Yes    Comment: socially "barely any"  . Drug use: Never    Past Surgical History:  Procedure Laterality Date  . CESAREAN SECTION N/A 01/12/2020   Procedure: CESAREAN SECTION;  Surgeon: Shea Evans, MD;  Location: [facility] ORS;  Service: Obstetrics;  Laterality: N/A;  . NO PAST SURGERIES      Family History  Problem Relation Age of Onset  . Thyroid disease Mother   . Thyroid disease Father   . Diabetes Maternal Uncle   . Hypercholesterolemia Maternal  Grandmother   . Hypercholesterolemia Maternal Grandfather   . Breast cancer Neg Hx   . Colon cancer Neg Hx     No Known Allergies  Current Medications:   Current Outpatient Medications:  .  NIFEdipine (ADALAT CC) 30 MG 24 hr tablet, Take 1 tablet (30 mg total) by mouth daily., Disp: 30 tablet, Rfl: 1 .  Prenatal MV-Min-Fe Fum-FA-DHA (PRENATAL 1 PO), Take 1 tablet by mouth daily., Disp: , Rfl:    Review of Systems:   ROS  Negative unless otherwise specified per HPI.  Vitals:   Vitals:   06/22/20 0930  BP: 120/88  Pulse: 90  Temp: (!) 97.4 F (36.3 C)  TempSrc: Temporal  SpO2: 98%  Weight: 219 lb 12.8 oz (99.7 kg)  Height: 5\' 5"  (1.651 m)     Body mass index is 36.58 kg/m.  Physical Exam:   Physical Exam Vitals and nursing note reviewed.  Constitutional:      General: She is not in acute distress.    Appearance: She is well-developed. She is not ill-appearing or toxic-appearing.  Cardiovascular:     Rate and Rhythm: Normal rate and regular rhythm.     Pulses: Normal pulses.     Heart sounds: Normal heart sounds, S1 normal and S2 normal.     Comments: No LE edema Pulmonary:     Effort: Pulmonary effort is  normal.     Breath sounds: Normal breath sounds.  Skin:    General: Skin is warm and dry.  Neurological:     Mental Status: She is alert.     GCS: GCS eye subscore is 4. GCS verbal subscore is 5. GCS motor subscore is 6.  Psychiatric:        Speech: Speech normal.        Behavior: Behavior normal. Behavior is cooperative.     Assessment and Plan:   Adonis was seen today for follow-up.  Diagnoses and all orders for this visit:  Pre-eclampsia, severe, postpartum condition -     Comprehensive metabolic panel -     CBC with Differential/Platelet   Normotensive in office. Having bordeline low BP readings at home. Agree with holding BP medication at this time. Update CBC and CMP today. Recommended:  "Continue to track blood pressure at home -- 3  times a week or so. If BP is consistently >130/80, please call and we will resume your medication." Patient agreeable to plan.  Jarold Motto, PA-C

## 2020-06-22 NOTE — Patient Instructions (Signed)
It was great to see you!  Let's update your liver and kidney function today.  Continue to track blood pressure at home -- 3 times a week or so.  If BP is consistently >130/80, please call and we will resume your medication.  Take care,  Jarold Motto PA-C

## 2020-07-03 ENCOUNTER — Encounter: Payer: Self-pay | Admitting: Physician Assistant

## 2021-05-31 DIAGNOSIS — Z01419 Encounter for gynecological examination (general) (routine) without abnormal findings: Secondary | ICD-10-CM | POA: Diagnosis not present

## 2021-05-31 DIAGNOSIS — Z113 Encounter for screening for infections with a predominantly sexual mode of transmission: Secondary | ICD-10-CM | POA: Diagnosis not present

## 2021-05-31 DIAGNOSIS — Z124 Encounter for screening for malignant neoplasm of cervix: Secondary | ICD-10-CM | POA: Diagnosis not present

## 2021-06-13 IMAGING — CR DG CHEST 2V
2 series · 2 of 2 positions shown · non-contrast
Comparison: None.

CLINICAL DATA: Chest heaviness for 2 days.  Nausea.

EXAM:
CHEST - 2 VIEW

[chest pa]
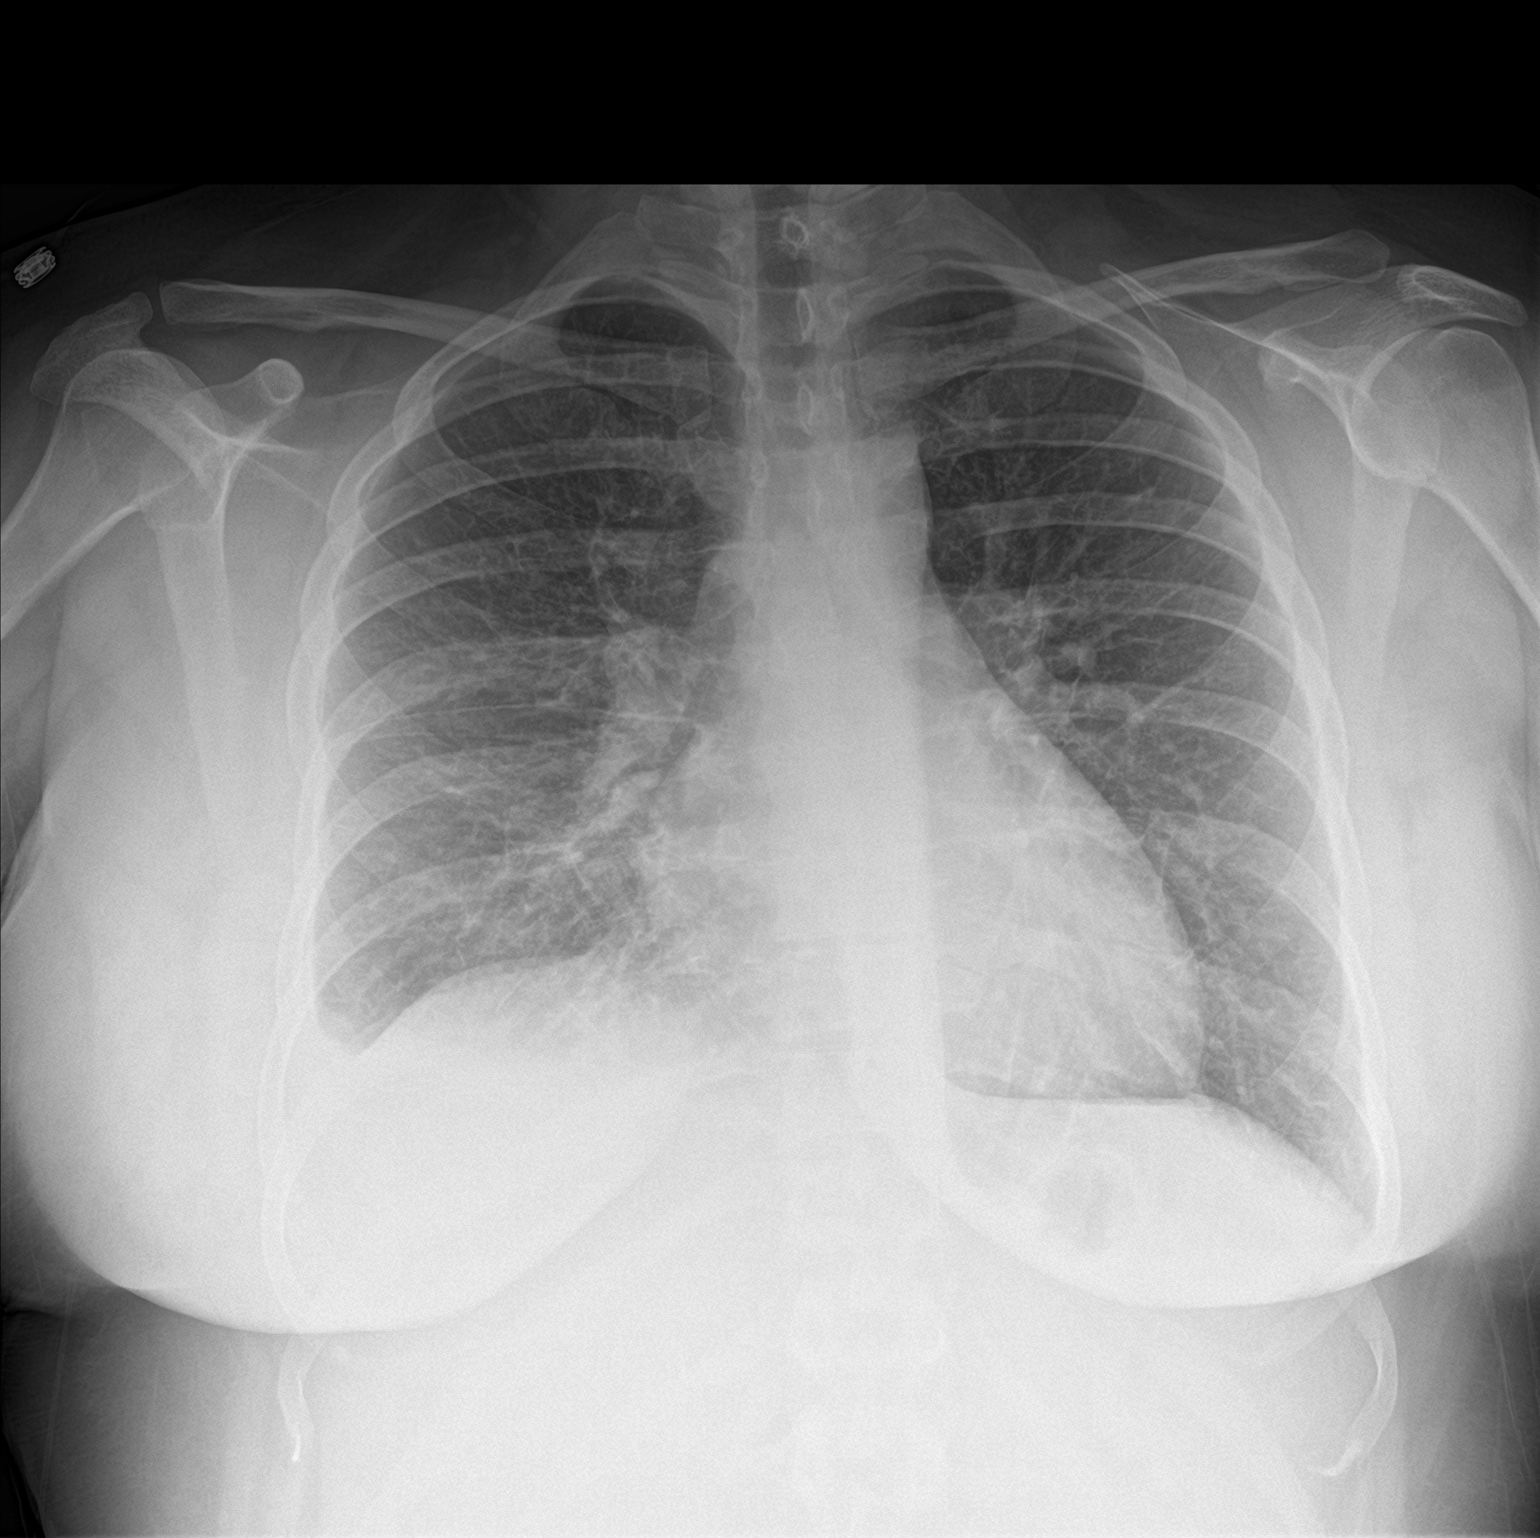

[chest lat]
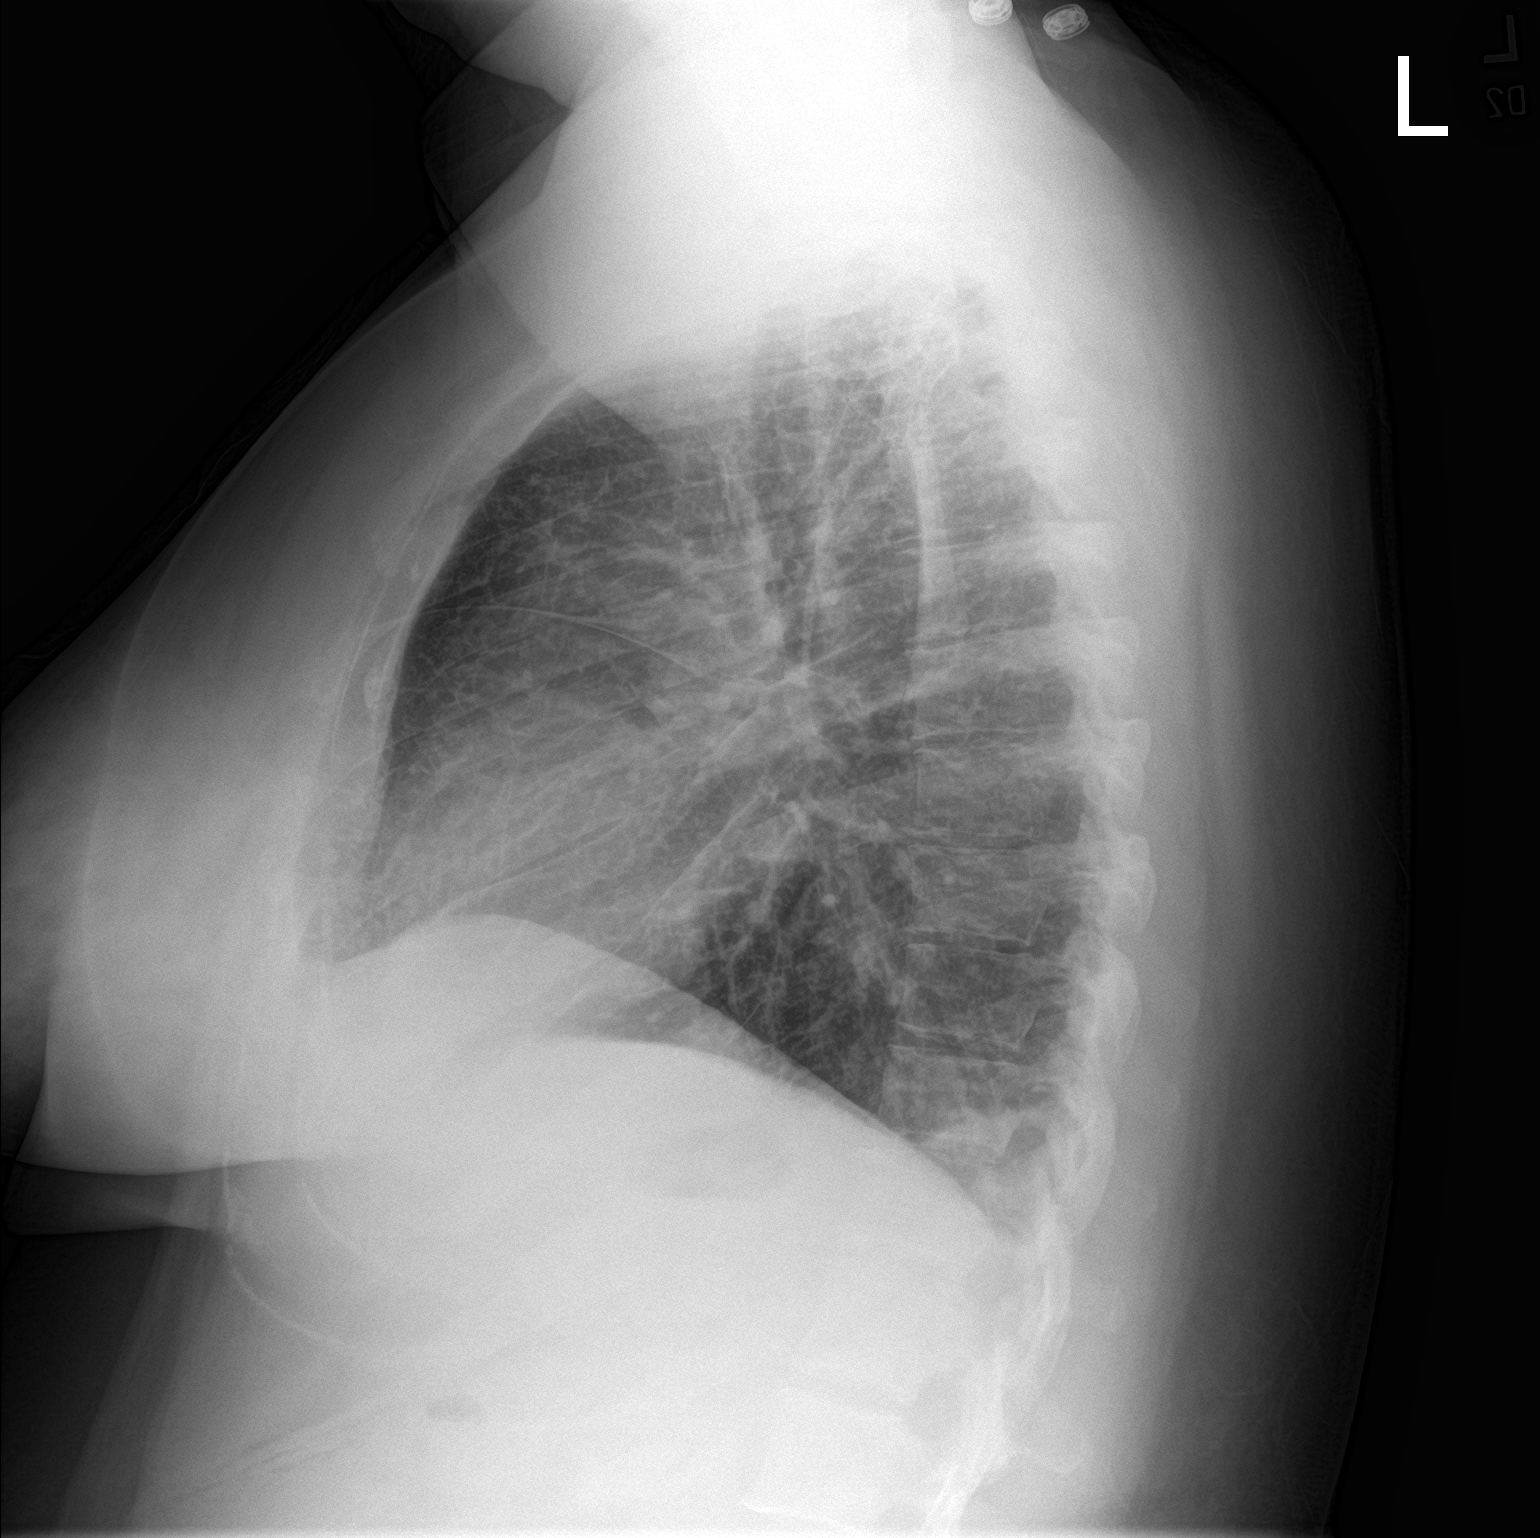

[2 of 2 positions shown; findings below may reference images not displayed]

FINDINGS: Cardiac silhouette normal in size. No mediastinal or hilar masses or
evidence of adenopathy.

Bilateral interstitial thickening. Small focus of opacity at the
posterolateral right lung base, likely atelectasis. Remainder of the
lungs is clear. No pleural effusion and no pneumothorax.

Skeletal structures are unremarkable.
IMPRESSION: 1. Mild interstitial thickening, which may reflect edema or
infection/inflammation. Small focus of opacity at the posterolateral
right lung base is likely atelectasis although infection is
possible.
2. No other abnormalities.

## 2021-06-27 ENCOUNTER — Ambulatory Visit (INDEPENDENT_AMBULATORY_CARE_PROVIDER_SITE_OTHER): Payer: BC Managed Care – PPO | Admitting: Physician Assistant

## 2021-06-27 ENCOUNTER — Encounter: Payer: Self-pay | Admitting: Physician Assistant

## 2021-06-27 VITALS — BP 102/70 | HR 84 | Temp 98.3°F | Ht 65.0 in | Wt 227.2 lb

## 2021-06-27 DIAGNOSIS — E669 Obesity, unspecified: Secondary | ICD-10-CM | POA: Diagnosis not present

## 2021-06-27 DIAGNOSIS — Z0000 Encounter for general adult medical examination without abnormal findings: Secondary | ICD-10-CM | POA: Diagnosis not present

## 2021-06-27 DIAGNOSIS — Z23 Encounter for immunization: Secondary | ICD-10-CM | POA: Diagnosis not present

## 2021-06-27 DIAGNOSIS — E7800 Pure hypercholesterolemia, unspecified: Secondary | ICD-10-CM

## 2021-06-27 LAB — CBC WITH DIFFERENTIAL/PLATELET
Basophils Absolute: 0 10*3/uL (ref 0.0–0.1)
Basophils Relative: 0.6 % (ref 0.0–3.0)
Eosinophils Absolute: 0.2 10*3/uL (ref 0.0–0.7)
Eosinophils Relative: 2.3 % (ref 0.0–5.0)
HCT: 42.1 % (ref 36.0–46.0)
Hemoglobin: 14 g/dL (ref 12.0–15.0)
Lymphocytes Relative: 36 % (ref 12.0–46.0)
Lymphs Abs: 2.6 10*3/uL (ref 0.7–4.0)
MCHC: 33.3 g/dL (ref 30.0–36.0)
MCV: 85.6 fl (ref 78.0–100.0)
Monocytes Absolute: 0.5 10*3/uL (ref 0.1–1.0)
Monocytes Relative: 6.4 % (ref 3.0–12.0)
Neutro Abs: 3.9 10*3/uL (ref 1.4–7.7)
Neutrophils Relative %: 54.7 % (ref 43.0–77.0)
Platelets: 300 10*3/uL (ref 150.0–400.0)
RBC: 4.92 Mil/uL (ref 3.87–5.11)
RDW: 13.8 % (ref 11.5–15.5)
WBC: 7.2 10*3/uL (ref 4.0–10.5)

## 2021-06-27 LAB — COMPREHENSIVE METABOLIC PANEL
ALT: 29 U/L (ref 0–35)
AST: 26 U/L (ref 0–37)
Albumin: 4.4 g/dL (ref 3.5–5.2)
Alkaline Phosphatase: 53 U/L (ref 39–117)
BUN: 18 mg/dL (ref 6–23)
CO2: 27 mEq/L (ref 19–32)
Calcium: 9.6 mg/dL (ref 8.4–10.5)
Chloride: 103 mEq/L (ref 96–112)
Creatinine, Ser: 0.81 mg/dL (ref 0.40–1.20)
GFR: 97.62 mL/min (ref >60.00)
Glucose, Bld: 91 mg/dL (ref 70–99)
Potassium: 4.2 mEq/L (ref 3.5–5.1)
Sodium: 137 mEq/L (ref 135–145)
Total Bilirubin: 0.6 mg/dL (ref 0.2–1.2)
Total Protein: 7.4 g/dL (ref 6.0–8.3)

## 2021-06-27 LAB — LIPID PANEL
Cholesterol: 218 mg/dL — ABNORMAL HIGH (ref 0–200)
HDL: 60.4 mg/dL (ref >39.00)
LDL Cholesterol: 147 mg/dL — ABNORMAL HIGH (ref 0–99)
NonHDL: 157.45
Total CHOL/HDL Ratio: 4
Triglycerides: 50 mg/dL (ref 0.0–149.0)
VLDL: 10 mg/dL (ref 0.0–40.0)

## 2021-06-27 NOTE — Progress Notes (Signed)
? ?Subjective:  ?  ?Vanessa Keith is a 31 y.o. female and is here for a comprehensive physical exam. ? ?HPI ? ?Health Maintenance Due  ?Topic Date Due  ? Hepatitis C Screening  Never done  ? TETANUS/TDAP  10/30/2019  ? ? ?Acute Concerns: ?None reported.  ? ?Chronic Issues: ?Elevated Cholesterol Levels  ?Hx of statin intolerance due to myalgia with atorvastatin. She has been trying to maintain a well balanced diet and exercise as able. Pt has a 70 month old and is currently renovating a home which provides her with a lot of daily exercise.  ? ? ?Health Maintenance: ?Immunizations -- Covid- UTD ?Influenza- Declined;2020 ?Tdap- UTD; 06/27/21 ?PAP -- UTD ?Bone Density -- N/A ?Diet -- Eats all food groups, trying to make healthier options ?Sleep habits -- No concerns ?Exercise -- As able; see above ?Current Weight -- Stable ?Weight History: ?Wt Readings from Last 10 Encounters:  ?06/27/21 227 lb 3.2 oz (103.1 kg)  ?06/22/20 219 lb 12.8 oz (99.7 kg)  ?01/23/20 244 lb (110.7 kg)  ?01/11/20 253 lb 4.8 oz (114.9 kg)  ?11/25/18 204 lb (92.5 kg)  ?03/12/18 198 lb (89.8 kg)  ?12/29/17 206 lb 8 oz (93.7 kg)  ?10/02/17 211 lb (95.7 kg)  ?09/04/17 216 lb 6.4 oz (98.2 kg)  ? ?Body mass index is 37.81 kg/m?. ?Mood -- Stable ? ?No LMP recorded. ?Period characteristics -- Still regulating, about 4-5 weeks apart ?Birth control -- Condoms used.  ? ? ? reports current alcohol use.  ?Tobacco Use: Low Risk   ? Smoking Tobacco Use: Never  ? Smokeless Tobacco Use: Never  ? Passive Exposure: Not on file  ? ? ? ? ?  06/22/2020  ?  9:28 AM  ?Depression screen PHQ 2/9  ?Decreased Interest 0  ?Down, Depressed, Hopeless 0  ?PHQ - 2 Score 0  ? ? ? ?Other providers/specialists: ?Patient Care Team: ?Jarold Motto, PA as PCP - General (Physician Assistant)  ? ?PMHx, SurgHx, SocialHx, Medications, and Allergies were reviewed in the Visit Navigator and updated as appropriate.  ? ?Past Medical History:  ?Diagnosis Date  ? Elevated cholesterol  03/06/2017  ? GERD (gastroesophageal reflux disease)   ? Hypertension 12/2019  ? ? ? ?Past Surgical History:  ?Procedure Laterality Date  ? CESAREAN SECTION N/A 01/12/2020  ? Procedure: CESAREAN SECTION;  Surgeon: Shea Evans, MD;  Location: [facility] LD [facility];  Service: Obstetrics;  Laterality: N/A;  ? NO PAST SURGERIES    ? ? ? ?Family History  ?Problem Relation Age of Onset  ? Thyroid disease Mother   ? Depression Mother   ? Thyroid disease Father   ? Diabetes Maternal Uncle   ? Hypercholesterolemia Maternal Grandmother   ? Hypercholesterolemia Maternal Grandfather   ? Breast cancer Neg Hx   ? Colon cancer Neg Hx   ? ? ?Social History  ? ?Tobacco Use  ? Smoking status: Never  ? Smokeless tobacco: Never  ?Vaping Use  ? Vaping Use: Never used  ?Substance Use Topics  ? Alcohol use: Yes  ?  Comment: socially "barely any"  ? Drug use: Never  ? ? ?Review of Systems:  ? ?Review of Systems  ?Constitutional:  Negative for chills, fever, malaise/fatigue and weight loss.  ?HENT:  Negative for hearing loss, sinus pain and sore throat.   ?Respiratory:  Negative for cough and hemoptysis.   ?Cardiovascular:  Negative for chest pain, palpitations, leg swelling and PND.  ?Gastrointestinal:  Negative for abdominal pain, constipation, diarrhea, heartburn, nausea and  vomiting.  ?Genitourinary:  Negative for dysuria, frequency and urgency.  ?Musculoskeletal:  Negative for back pain, myalgias and neck pain.  ?Skin:  Negative for itching and rash.  ?Neurological:  Negative for dizziness, tingling, seizures and headaches.  ?Endo/Heme/Allergies:  Negative for polydipsia.  ?Psychiatric/Behavioral:  Negative for depression. The patient is not nervous/anxious.   ? ? ? ?Objective:  ? ?BP 102/70   Pulse 84   Temp 98.3 ?F (36.8 ?C)   Ht  (1.651 m)   Wt 227 lb 3.2 oz (103.1 kg)   SpO2 97%   BMI 37.81 kg/m?  ? ?General Appearance:    Alert, cooperative, no distress, appears stated age  ?Head:    Normocephalic, without obvious abnormality,  atraumatic  ?Eyes:    PERRL, conjunctiva/corneas clear, EOM's intact, fundi  ?  benign, both eyes  ?Ears:    Normal TM's and external ear canals, both ears  ?Nose:   Nares normal, septum midline, mucosa normal, no drainage    or sinus tenderness  ?Throat:   Lips, mucosa, and tongue normal; teeth and gums normal  ?Neck:   Supple, symmetrical, trachea midline, no adenopathy;  ?  thyroid:  no enlargement/tenderness/nodules; no carotid ?  bruit or JVD  ?Back:     Symmetric, no curvature, ROM normal, no CVA tenderness  ?Lungs:     Clear to auscultation bilaterally, respirations unlabored  ?Chest Wall:    No tenderness or deformity  ? Heart:    Regular rate and rhythm, S1 and S2 normal, no murmur, rub   or gallop  ?Breast Exam:    Deferred  ?Abdomen:     Soft, non-tender, bowel sounds active all four quadrants,  ?  no masses, no organomegaly  ?Genitalia:    Deferred  ?Rectal:    Deferred  ?Extremities:   Extremities normal, atraumatic, no cyanosis or edema  ?Pulses:   2+ and symmetric all extremities  ?Skin:   Skin color, texture, turgor normal, no rashes or lesions  ?Lymph nodes:   Cervical, supraclavicular, and axillary nodes normal  ?Neurologic:   CNII-XII intact, normal strength, sensation and reflexes  ?  throughout  ? ? ?Assessment/Plan:  ? ?Routine physical examination ?Today patient counseled on age appropriate routine health concerns for screening and prevention, each reviewed and up to date or declined. Immunizations reviewed and up to date or declined. Labs ordered and reviewed. Risk factors for depression reviewed and negative. Hearing function and visual acuity are intact. ADLs screened and addressed as needed. Functional ability and level of safety reviewed and appropriate. Education, counseling and referrals performed based on assessed risks today. Patient provided with a copy of personalized plan for preventive services. ? ?Elevated cholesterol ?Update lipid profile, will advise on medication as indicated  by results  ? ?Obesity, unspecified classification, unspecified obesity type, unspecified whether serious comorbidity present ?Encouraged patient to participate in healthy eating and regular exercise  ? ?Need for Tdap vaccination ?Completed today ? ? ?Patient Counseling: ? ? ?    ? Nutrition: Stressed importance of moderation in sodium/caffeine intake, saturated fat and cholesterol, caloric balance, sufficient intake of fresh fruits, vegetables, fiber, calcium, iron, and 1 mg of folate supplement per day (for females capable of pregnancy).  ? ?    ?  ?Stressed the importance of regular exercise.   ? ?    ? Substance Abuse: Discussed cessation/primary prevention of tobacco, alcohol, or other drug use; driving or other dangerous activities under the influence; availability of treatment for abuse.   ? ?[  x]     Injury prevention: Discussed safety belts, safety helmets, smoke detector, smoking near bedding or upholstery.   ? ?[x]    ? ? Sexuality: Discussed sexually transmitted diseases, partner selection, use of condoms, avoidance of unintended pregnancy  and contraceptive alternatives.   ? ?[x]    ? Dental health: Discussed importance of regular tooth brushing, flossing, and dental visits.  ? ?[x]     ? Health maintenance and immunizations reviewed. Please refer to Health maintenance section.  ? ?I,Havlyn C Ratchford,acting as a scribe for , PA.,have documented all relevant documentation on the behalf of , PA,as directed by  , PA while in the presence of [employer], Jarold Motto. ? ?IJarold Motto, PA, have reviewed all documentation for this visit. The documentation on 06/27/21 for the exam, diagnosis, procedures, and orders are all accurate and complete. ? ? ?[place], PA-C ?[facility] ? ? ? ?

## 2021-06-27 NOTE — Patient Instructions (Signed)
It was great to see you! ? ?Please go to the lab for blood work.  ? ?Our office will call you with your results unless you have chosen to receive results via MyChart. ? ?If your blood work is normal we will follow-up each year for physicals and as scheduled for chronic medical problems. ? ?If anything is abnormal we will treat accordingly and get you in for a follow-up. ? ?Take care, ? ?Vanessa Keith ?  ? ? ?

## 2021-08-06 DIAGNOSIS — R059 Cough, unspecified: Secondary | ICD-10-CM | POA: Diagnosis not present

## 2021-08-06 DIAGNOSIS — R0981 Nasal congestion: Secondary | ICD-10-CM | POA: Diagnosis not present

## 2021-08-06 DIAGNOSIS — J029 Acute pharyngitis, unspecified: Secondary | ICD-10-CM | POA: Diagnosis not present

## 2021-08-06 DIAGNOSIS — H9203 Otalgia, bilateral: Secondary | ICD-10-CM | POA: Diagnosis not present

## 2021-12-23 DIAGNOSIS — Z3200 Encounter for pregnancy test, result unknown: Secondary | ICD-10-CM | POA: Diagnosis not present

## 2021-12-23 LAB — OB RESULTS CONSOLE ABO/RH: RH Type: POSITIVE

## 2021-12-23 LAB — OB RESULTS CONSOLE ANTIBODY SCREEN: Antibody Screen: NEGATIVE

## 2022-01-08 DIAGNOSIS — Z3201 Encounter for pregnancy test, result positive: Secondary | ICD-10-CM | POA: Diagnosis not present

## 2022-01-21 DIAGNOSIS — O3680X0 Pregnancy with inconclusive fetal viability, not applicable or unspecified: Secondary | ICD-10-CM | POA: Diagnosis not present

## 2022-01-21 DIAGNOSIS — Z3A01 Less than 8 weeks gestation of pregnancy: Secondary | ICD-10-CM | POA: Diagnosis not present

## 2022-01-21 DIAGNOSIS — O3680X9 Pregnancy with inconclusive fetal viability, other fetus: Secondary | ICD-10-CM | POA: Diagnosis not present

## 2022-02-11 DIAGNOSIS — O169 Unspecified maternal hypertension, unspecified trimester: Secondary | ICD-10-CM | POA: Diagnosis not present

## 2022-02-11 DIAGNOSIS — Z3689 Encounter for other specified antenatal screening: Secondary | ICD-10-CM | POA: Diagnosis not present

## 2022-02-11 DIAGNOSIS — O161 Unspecified maternal hypertension, first trimester: Secondary | ICD-10-CM | POA: Diagnosis not present

## 2022-02-11 DIAGNOSIS — Z118 Encounter for screening for other infectious and parasitic diseases: Secondary | ICD-10-CM | POA: Diagnosis not present

## 2022-02-11 DIAGNOSIS — Z3481 Encounter for supervision of other normal pregnancy, first trimester: Secondary | ICD-10-CM | POA: Diagnosis not present

## 2022-02-11 DIAGNOSIS — Z3A10 10 weeks gestation of pregnancy: Secondary | ICD-10-CM | POA: Diagnosis not present

## 2022-02-11 LAB — OB RESULTS CONSOLE GC/CHLAMYDIA
Chlamydia: NEGATIVE
Neisseria Gonorrhea: NEGATIVE

## 2022-02-11 LAB — OB RESULTS CONSOLE HEPATITIS B SURFACE ANTIGEN: Hepatitis B Surface Ag: NEGATIVE

## 2022-02-11 LAB — OB RESULTS CONSOLE HIV ANTIBODY (ROUTINE TESTING): HIV: NONREACTIVE

## 2022-02-11 LAB — OB RESULTS CONSOLE RPR: RPR: NONREACTIVE

## 2022-02-11 LAB — OB RESULTS CONSOLE RUBELLA ANTIBODY, IGM: Rubella: IMMUNE

## 2022-02-21 DIAGNOSIS — Z3A12 12 weeks gestation of pregnancy: Secondary | ICD-10-CM | POA: Diagnosis not present

## 2022-02-21 DIAGNOSIS — O161 Unspecified maternal hypertension, first trimester: Secondary | ICD-10-CM | POA: Diagnosis not present

## 2022-02-21 DIAGNOSIS — O169 Unspecified maternal hypertension, unspecified trimester: Secondary | ICD-10-CM | POA: Diagnosis not present

## 2022-04-03 DIAGNOSIS — Z361 Encounter for antenatal screening for raised alphafetoprotein level: Secondary | ICD-10-CM | POA: Diagnosis not present

## 2022-04-14 DIAGNOSIS — Z3A19 19 weeks gestation of pregnancy: Secondary | ICD-10-CM | POA: Diagnosis not present

## 2022-04-14 DIAGNOSIS — O368930 Maternal care for other specified fetal problems, third trimester, not applicable or unspecified: Secondary | ICD-10-CM | POA: Diagnosis not present

## 2022-06-02 DIAGNOSIS — Z3689 Encounter for other specified antenatal screening: Secondary | ICD-10-CM | POA: Diagnosis not present

## 2022-06-30 DIAGNOSIS — Z3A30 30 weeks gestation of pregnancy: Secondary | ICD-10-CM | POA: Diagnosis not present

## 2022-06-30 DIAGNOSIS — Z3689 Encounter for other specified antenatal screening: Secondary | ICD-10-CM | POA: Diagnosis not present

## 2022-06-30 DIAGNOSIS — O163 Unspecified maternal hypertension, third trimester: Secondary | ICD-10-CM | POA: Diagnosis not present

## 2022-07-25 ENCOUNTER — Other Ambulatory Visit: Payer: Self-pay | Admitting: Obstetrics and Gynecology

## 2022-08-05 DIAGNOSIS — O163 Unspecified maternal hypertension, third trimester: Secondary | ICD-10-CM | POA: Diagnosis not present

## 2022-08-05 DIAGNOSIS — Z3685 Encounter for antenatal screening for Streptococcus B: Secondary | ICD-10-CM | POA: Diagnosis not present

## 2022-08-05 DIAGNOSIS — Z3A35 35 weeks gestation of pregnancy: Secondary | ICD-10-CM | POA: Diagnosis not present

## 2022-08-05 LAB — OB RESULTS CONSOLE GBS: GBS: NEGATIVE

## 2022-08-14 ENCOUNTER — Encounter (HOSPITAL_COMMUNITY): Payer: Self-pay | Admitting: *Deleted

## 2022-08-14 NOTE — Patient Instructions (Addendum)
Vanessa Keith  08/14/2022   Your procedure is scheduled on:  08/29/2022  Arrive at 1130 at [employer] on [address] at [facility]  and [employer]. You are invited to use the FREE valet parking or use the Visitor's parking deck.  Pick up the phone at the desk and dial [phone].  Call this number if you have problems the morning of surgery: [phone]  Remember:   Do not eat food:(After Midnight) Desps de medianoche.  Do not drink clear liquids: (After Midnight) Desps de medianoche.  Take these medicines the morning of surgery with A SIP OF WATER:  none   Do not wear jewelry, make-up or nail polish.  Do not wear lotions, powders, or perfumes. Do not wear deodorant.  Do not shave 48 hours prior to surgery.  Do not bring valuables to the hospital.  [facility] is not   responsible for any belongings or valuables brought to the hospital.  Contacts, dentures or bridgework may not be worn into surgery.  Leave suitcase in the car. After surgery it may be brought to your room.  For patients admitted to the hospital, checkout time is 11:00 AM the day of              discharge.      Please read over the following fact sheets that you were given:     Preparing for Surgery

## 2022-08-15 ENCOUNTER — Telehealth (HOSPITAL_COMMUNITY): Payer: Self-pay | Admitting: *Deleted

## 2022-08-15 ENCOUNTER — Encounter (HOSPITAL_COMMUNITY): Payer: Self-pay

## 2022-08-15 NOTE — Telephone Encounter (Signed)
Preadmission screen  

## 2022-08-19 ENCOUNTER — Telehealth (HOSPITAL_COMMUNITY): Payer: Self-pay | Admitting: *Deleted

## 2022-08-19 NOTE — Telephone Encounter (Signed)
Preadmission screen  

## 2022-08-21 ENCOUNTER — Encounter (HOSPITAL_COMMUNITY): Payer: Self-pay

## 2022-08-25 DIAGNOSIS — Z23 Encounter for immunization: Secondary | ICD-10-CM | POA: Diagnosis not present

## 2022-08-27 ENCOUNTER — Encounter (HOSPITAL_COMMUNITY)
Admission: RE | Admit: 2022-08-27 | Discharge: 2022-08-27 | Disposition: A | Discharge status: Home / Self Care | Payer: BC Managed Care – PPO | Source: Ambulatory Visit | Attending: Obstetrics and Gynecology | Admitting: Obstetrics and Gynecology

## 2022-08-27 DIAGNOSIS — Z3A39 39 weeks gestation of pregnancy: Secondary | ICD-10-CM | POA: Diagnosis not present

## 2022-08-27 DIAGNOSIS — O34211 Maternal care for low transverse scar from previous cesarean delivery: Secondary | ICD-10-CM | POA: Diagnosis not present

## 2022-08-27 DIAGNOSIS — E669 Obesity, unspecified: Secondary | ICD-10-CM | POA: Diagnosis not present

## 2022-08-27 DIAGNOSIS — O9081 Anemia of the puerperium: Secondary | ICD-10-CM | POA: Diagnosis not present

## 2022-08-27 DIAGNOSIS — O99214 Obesity complicating childbirth: Secondary | ICD-10-CM | POA: Diagnosis not present

## 2022-08-27 DIAGNOSIS — Z01812 Encounter for preprocedural laboratory examination: Secondary | ICD-10-CM | POA: Diagnosis not present

## 2022-08-27 HISTORY — DX: Supervision of pregnancy with other poor reproductive or obstetric history, unspecified trimester: O09.299

## 2022-08-27 LAB — CBC
HCT: 40 % (ref 36.0–46.0)
Hemoglobin: 13.3 g/dL (ref 12.0–15.0)
MCH: 28.8 pg (ref 26.0–34.0)
MCHC: 33.3 g/dL (ref 30.0–36.0)
MCV: 86.6 fL (ref 80.0–100.0)
Platelets: 240 10*3/uL (ref 150–400)
RBC: 4.62 MIL/uL (ref 3.87–5.11)
RDW: 14.6 % (ref 11.5–15.5)
WBC: 11.2 10*3/uL — ABNORMAL HIGH (ref 4.0–10.5)
nRBC: 0 % (ref 0.0–0.2)

## 2022-08-27 LAB — TYPE AND SCREEN
ABO/RH(D): O POS
Antibody Screen: NEGATIVE

## 2022-08-27 LAB — RPR: RPR Ser Ql: NONREACTIVE

## 2022-08-27 NOTE — Patient Instructions (Signed)
Meline Swider  08/27/2022   Your procedure is scheduled on:  08/29/22  Arrive at 1115 at Entrance C on [address] at [facility]  and [employer]. You are invited to use the FREE valet parking or use the Visitor's parking deck.  Pick up the phone at the desk and dial [phone].  Call this number if you have problems the morning of surgery: [phone]  Remember:   Do not eat food:(After Midnight) Desps de medianoche.  Do not drink clear liquids: (After Midnight) Desps de medianoche.  Take these medicines the morning of surgery with A SIP OF WATER:  none   Do not wear jewelry, make-up or nail polish.  Do not wear lotions, powders, or perfumes. Do not wear deodorant.  Do not shave 48 hours prior to surgery.  Do not bring valuables to the hospital.  [facility] is not   responsible for any belongings or valuables brought to the hospital.  Contacts, dentures or bridgework may not be worn into surgery.  Leave suitcase in the car. After surgery it may be brought to your room.  For patients admitted to the hospital, checkout time is 11:00 AM the day of              discharge.      Please read over the following fact sheets that you were given:     Preparing for Surgery

## 2022-08-28 NOTE — Anesthesia Preprocedure Evaluation (Addendum)
Anesthesia Evaluation  Patient identified by MRN, date of birth, ID band Patient awake    Reviewed: Allergy & Precautions, NPO status , Patient's Chart, lab work & pertinent test results  Airway Mallampati: II  TM Distance: >3 FB Neck ROM: Full    Dental no notable dental hx. (+) Teeth Intact, Dental Advisory Given   Pulmonary neg pulmonary ROS   Pulmonary exam normal breath sounds clear to auscultation       Cardiovascular Normal cardiovascular exam Rhythm:Regular Rate:Normal     Neuro/Psych negative neurological ROS  negative psych ROS   GI/Hepatic ,GERD  ,,  Endo/Other    Renal/GU      Musculoskeletal   Abdominal  (+) + obese (BMI 41.42)  Peds  Hematology Lab Results      Component                Value               Date                      WBC                      11.2 (H)            08/27/2022                HGB                      13.3                08/27/2022                HCT                      40.0                08/27/2022                MCV                      86.6                08/27/2022                PLT                      240                 08/27/2022              Anesthesia Other Findings   Reproductive/Obstetrics (+) Pregnancy                              Anesthesia Physical Anesthesia Plan  ASA: 3  Anesthesia Plan: Spinal   Post-op Pain Management: Minimal or no pain anticipated and Regional block*   Induction: Intravenous  PONV Risk Score and Plan: Treatment may vary due to age or medical condition  Airway Management Planned: Nasal Cannula and Simple Face Mask  Additional Equipment:   Intra-op Plan:   Post-operative Plan:   Informed Consent: I have reviewed the patients History and Physical, chart, labs and discussed the procedure including the risks, benefits and alternatives for the proposed anesthesia with the patient or authorized  representative who has indicated his/her understanding and acceptance.  Dental advisory given  Plan Discussed with:   Anesthesia Plan Comments: (39.1 wk G2P1 w BMI 41.4 for repeat c/s)         Anesthesia Quick Evaluation

## 2022-08-29 ENCOUNTER — Encounter (HOSPITAL_COMMUNITY): Payer: Self-pay | Admitting: Obstetrics and Gynecology

## 2022-08-29 ENCOUNTER — Other Ambulatory Visit: Payer: Self-pay

## 2022-08-29 ENCOUNTER — Inpatient Hospital Stay (HOSPITAL_COMMUNITY): Payer: BC Managed Care – PPO | Admitting: Anesthesiology

## 2022-08-29 ENCOUNTER — Encounter (HOSPITAL_COMMUNITY)
Admission: AD | Disposition: A | Discharge status: Home / Self Care | Payer: Self-pay | Source: Home / Self Care | Attending: Obstetrics and Gynecology

## 2022-08-29 ENCOUNTER — Inpatient Hospital Stay (HOSPITAL_COMMUNITY)
Admission: AD | Admit: 2022-08-29 | Discharge: 2022-08-31 | DRG: 788 | Disposition: A | Discharge status: Home / Self Care | Payer: BC Managed Care – PPO | Attending: Obstetrics and Gynecology | Admitting: Obstetrics and Gynecology

## 2022-08-29 DIAGNOSIS — O99214 Obesity complicating childbirth: Secondary | ICD-10-CM | POA: Diagnosis present

## 2022-08-29 DIAGNOSIS — Z01812 Encounter for preprocedural laboratory examination: Secondary | ICD-10-CM

## 2022-08-29 DIAGNOSIS — O34211 Maternal care for low transverse scar from previous cesarean delivery: Principal | ICD-10-CM | POA: Diagnosis present

## 2022-08-29 DIAGNOSIS — O9081 Anemia of the puerperium: Secondary | ICD-10-CM | POA: Diagnosis not present

## 2022-08-29 DIAGNOSIS — Z3A39 39 weeks gestation of pregnancy: Secondary | ICD-10-CM | POA: Diagnosis not present

## 2022-08-29 DIAGNOSIS — Z98891 History of uterine scar from previous surgery: Principal | ICD-10-CM

## 2022-08-29 SURGERY — Surgical Case
Anesthesia: Spinal

## 2022-08-29 MED ORDER — SODIUM CHLORIDE 0.9% FLUSH: 3.0000 mL | INTRAVENOUS | Status: DC | PRN

## 2022-08-29 MED ORDER — DEXAMETHASONE SODIUM PHOSPHATE 10 MG/ML IJ SOLN
INTRAMUSCULAR | Status: DC | PRN
  Administered 2022-08-29: 10 mg via INTRAVENOUS

## 2022-08-29 MED ORDER — ACETAMINOPHEN 500 MG PO TABS
1000.0000 mg | ORAL_TABLET | Freq: Four times a day (QID) | ORAL | Status: DC
  Administered 2022-08-29 – 2022-08-31 (×8): 1000 mg via ORAL
  Filled 2022-08-29 (×8): qty 2

## 2022-08-29 MED ORDER — EPHEDRINE SULFATE-NACL 50-0.9 MG/10ML-% IV SOSY
PREFILLED_SYRINGE | INTRAVENOUS | Status: DC | PRN
  Administered 2022-08-29: 5 mg via INTRAVENOUS

## 2022-08-29 MED ORDER — OXYCODONE HCL 5 MG PO TABS: 5.0000 mg | ORAL_TABLET | Freq: Once | ORAL | Status: DC | PRN

## 2022-08-29 MED ORDER — NALOXONE HCL 4 MG/10ML IJ SOLN: 1.0000 ug/kg/h | INTRAVENOUS | Status: DC | PRN

## 2022-08-29 MED ORDER — WITCH HAZEL-GLYCERIN EX PADS: 1.0000 | MEDICATED_PAD | CUTANEOUS | Status: DC | PRN

## 2022-08-29 MED ORDER — OXYCODONE HCL 5 MG/5ML PO SOLN: 5.0000 mg | Freq: Once | ORAL | Status: DC | PRN

## 2022-08-29 MED ORDER — SCOPOLAMINE 1 MG/3DAYS TD PT72
MEDICATED_PATCH | TRANSDERMAL | Status: AC
  Filled 2022-08-29: qty 1

## 2022-08-29 MED ORDER — MORPHINE SULFATE (PF) 0.5 MG/ML IJ SOLN
INTRAMUSCULAR | Status: DC | PRN
  Administered 2022-08-29: 150 ug via INTRATHECAL

## 2022-08-29 MED ORDER — DIPHENHYDRAMINE HCL 50 MG/ML IJ SOLN: 12.5000 mg | INTRAMUSCULAR | Status: DC | PRN

## 2022-08-29 MED ORDER — COCONUT OIL OIL: 1.0000 | TOPICAL_OIL | Status: DC | PRN

## 2022-08-29 MED ORDER — LACTATED RINGERS IV SOLN: INTRAVENOUS | Status: DC | PRN

## 2022-08-29 MED ORDER — STERILE WATER FOR IRRIGATION IR SOLN
Status: DC | PRN
  Administered 2022-08-29: 1000 mL

## 2022-08-29 MED ORDER — OXYTOCIN-SODIUM CHLORIDE 30-0.9 UT/500ML-% IV SOLN: 2.5000 [IU]/h | INTRAVENOUS | Status: AC

## 2022-08-29 MED ORDER — DIPHENHYDRAMINE HCL 25 MG PO CAPS: 25.0000 mg | ORAL_CAPSULE | Freq: Four times a day (QID) | ORAL | Status: DC | PRN

## 2022-08-29 MED ORDER — SIMETHICONE 80 MG PO CHEW: 80.0000 mg | CHEWABLE_TABLET | ORAL | Status: DC | PRN

## 2022-08-29 MED ORDER — FENTANYL CITRATE (PF) 100 MCG/2ML IJ SOLN
INTRAMUSCULAR | Status: AC
  Filled 2022-08-29: qty 2

## 2022-08-29 MED ORDER — CEFAZOLIN SODIUM-DEXTROSE 2-4 GM/100ML-% IV SOLN: 2.0000 g | INTRAVENOUS | Status: DC

## 2022-08-29 MED ORDER — KETOROLAC TROMETHAMINE 30 MG/ML IJ SOLN
30.0000 mg | Freq: Once | INTRAMUSCULAR | Status: AC | PRN
  Administered 2022-08-29: 30 mg via INTRAVENOUS

## 2022-08-29 MED ORDER — FENTANYL CITRATE (PF) 100 MCG/2ML IJ SOLN
INTRAMUSCULAR | Status: DC | PRN
  Administered 2022-08-29: 15 ug via INTRATHECAL

## 2022-08-29 MED ORDER — KETOROLAC TROMETHAMINE 30 MG/ML IJ SOLN
30.0000 mg | Freq: Four times a day (QID) | INTRAMUSCULAR | Status: AC
  Administered 2022-08-29 – 2022-08-30 (×4): 30 mg via INTRAVENOUS
  Filled 2022-08-29 (×4): qty 1

## 2022-08-29 MED ORDER — LACTATED RINGERS IV SOLN: INTRAVENOUS | Status: DC

## 2022-08-29 MED ORDER — PRENATAL MULTIVITAMIN CH
1.0000 | ORAL_TABLET | Freq: Every day | ORAL | Status: DC
  Administered 2022-08-30 – 2022-08-31 (×2): 1 via ORAL
  Filled 2022-08-29 (×2): qty 1

## 2022-08-29 MED ORDER — ONDANSETRON HCL 4 MG/2ML IJ SOLN
INTRAMUSCULAR | Status: DC | PRN
  Administered 2022-08-29: 4 mg via INTRAVENOUS

## 2022-08-29 MED ORDER — KETOROLAC TROMETHAMINE 30 MG/ML IJ SOLN
INTRAMUSCULAR | Status: AC
  Filled 2022-08-29: qty 1

## 2022-08-29 MED ORDER — HYDROCODONE-ACETAMINOPHEN 5-325 MG PO TABS: 1.0000 | ORAL_TABLET | ORAL | Status: DC | PRN

## 2022-08-29 MED ORDER — HYDROMORPHONE HCL 1 MG/ML IJ SOLN: 0.2500 mg | INTRAMUSCULAR | Status: DC | PRN

## 2022-08-29 MED ORDER — CEFAZOLIN SODIUM-DEXTROSE 2-4 GM/100ML-% IV SOLN
INTRAVENOUS | Status: AC
  Filled 2022-08-29: qty 100

## 2022-08-29 MED ORDER — BUPIVACAINE IN DEXTROSE 0.75-8.25 % IT SOLN
INTRATHECAL | Status: DC | PRN
  Administered 2022-08-29: 12 mg via INTRATHECAL

## 2022-08-29 MED ORDER — DIPHENHYDRAMINE HCL 25 MG PO CAPS: 25.0000 mg | ORAL_CAPSULE | ORAL | Status: DC | PRN

## 2022-08-29 MED ORDER — ACETAMINOPHEN 10 MG/ML IV SOLN
INTRAVENOUS | Status: DC | PRN
  Administered 2022-08-29: 1000 mg via INTRAVENOUS

## 2022-08-29 MED ORDER — DIBUCAINE (PERIANAL) 1 % EX OINT: 1.0000 | TOPICAL_OINTMENT | CUTANEOUS | Status: DC | PRN

## 2022-08-29 MED ORDER — MEPERIDINE HCL 25 MG/ML IJ SOLN: 6.2500 mg | INTRAMUSCULAR | Status: DC | PRN

## 2022-08-29 MED ORDER — MENTHOL 3 MG MT LOZG: 1.0000 | LOZENGE | OROMUCOSAL | Status: DC | PRN

## 2022-08-29 MED ORDER — OXYTOCIN-SODIUM CHLORIDE 30-0.9 UT/500ML-% IV SOLN
INTRAVENOUS | Status: DC | PRN
  Administered 2022-08-29: 300 mL via INTRAVENOUS
  Administered 2022-08-29: 100 mL via INTRAVENOUS

## 2022-08-29 MED ORDER — CEFAZOLIN SODIUM-DEXTROSE 2-3 GM-%(50ML) IV SOLR
INTRAVENOUS | Status: DC | PRN
  Administered 2022-08-29: 2 g via INTRAVENOUS

## 2022-08-29 MED ORDER — PHENYLEPHRINE HCL-NACL 20-0.9 MG/250ML-% IV SOLN
INTRAVENOUS | Status: DC | PRN
  Administered 2022-08-29: 60 ug/min via INTRAVENOUS

## 2022-08-29 MED ORDER — MORPHINE SULFATE (PF) 0.5 MG/ML IJ SOLN
INTRAMUSCULAR | Status: AC
  Filled 2022-08-29: qty 10

## 2022-08-29 MED ORDER — SCOPOLAMINE 1 MG/3DAYS TD PT72
1.0000 | MEDICATED_PATCH | Freq: Once | TRANSDERMAL | Status: DC
  Administered 2022-08-29: 1.5 mg via TRANSDERMAL

## 2022-08-29 MED ORDER — ONDANSETRON HCL 4 MG/2ML IJ SOLN: 4.0000 mg | Freq: Once | INTRAMUSCULAR | Status: DC | PRN

## 2022-08-29 MED ORDER — ONDANSETRON HCL 4 MG/2ML IJ SOLN: 4.0000 mg | Freq: Three times a day (TID) | INTRAMUSCULAR | Status: DC | PRN

## 2022-08-29 MED ORDER — POVIDONE-IODINE 10 % EX SWAB
2.0000 | Freq: Once | CUTANEOUS | Status: AC
  Administered 2022-08-29: 2 via TOPICAL

## 2022-08-29 MED ORDER — IBUPROFEN 600 MG PO TABS
600.0000 mg | ORAL_TABLET | Freq: Four times a day (QID) | ORAL | Status: DC
  Administered 2022-08-31 (×3): 600 mg via ORAL
  Filled 2022-08-29 (×3): qty 1

## 2022-08-29 MED ORDER — SIMETHICONE 80 MG PO CHEW
80.0000 mg | CHEWABLE_TABLET | Freq: Three times a day (TID) | ORAL | Status: DC
  Administered 2022-08-29 – 2022-08-31 (×5): 80 mg via ORAL
  Filled 2022-08-29 (×5): qty 1

## 2022-08-29 MED ORDER — SENNOSIDES-DOCUSATE SODIUM 8.6-50 MG PO TABS
2.0000 | ORAL_TABLET | Freq: Every day | ORAL | Status: DC
  Administered 2022-08-30 – 2022-08-31 (×2): 2 via ORAL
  Filled 2022-08-29 (×2): qty 2

## 2022-08-29 MED ORDER — NALOXONE HCL 0.4 MG/ML IJ SOLN: 0.4000 mg | INTRAMUSCULAR | Status: DC | PRN

## 2022-08-29 SURGICAL SUPPLY — 34 items
APL PRP STRL LF DISP 70% ISPRP (MISCELLANEOUS) ×2
APL SKNCLS STERI-STRIP NONHPOA (GAUZE/BANDAGES/DRESSINGS) ×1
BENZOIN TINCTURE PRP APPL 2/3 (GAUZE/BANDAGES/DRESSINGS) ×1 IMPLANT
CHLORAPREP W/TINT 26 (MISCELLANEOUS) ×2 IMPLANT
CLAMP UMBILICAL CORD (MISCELLANEOUS) ×1 IMPLANT
CLOTH BEACON ORANGE TIMEOUT ST (SAFETY) ×1 IMPLANT
DRSG OPSITE POSTOP 4X10 (GAUZE/BANDAGES/DRESSINGS) ×1 IMPLANT
ELECT REM PT RETURN 9FT ADLT (ELECTROSURGICAL) ×1
ELECTRODE REM PT RTRN 9FT ADLT (ELECTROSURGICAL) ×1 IMPLANT
EXTRACTOR VACUUM KIWI (MISCELLANEOUS) ×1 IMPLANT
GLOVE BIO SURGEON STRL SZ 6 (GLOVE) ×1 IMPLANT
GLOVE BIOGEL PI IND STRL 6.5 (GLOVE) ×1 IMPLANT
GOWN STRL REUS W/TWL LRG LVL3 (GOWN DISPOSABLE) ×2 IMPLANT
KIT ABG SYR 3ML LUER SLIP (SYRINGE) IMPLANT
MAT PREVALON FULL STRYKER (MISCELLANEOUS) IMPLANT
NDL HYPO 25X5/8 SAFETYGLIDE (NEEDLE) IMPLANT
NEEDLE HYPO 25X5/8 SAFETYGLIDE (NEEDLE) IMPLANT
NS IRRIG 1000ML POUR BTL (IV SOLUTION) ×1 IMPLANT
PACK C SECTION WH (CUSTOM PROCEDURE TRAY) ×1 IMPLANT
PAD OB MATERNITY 4.3X12.25 (PERSONAL CARE ITEMS) ×1 IMPLANT
RETRACTOR WND ALEXIS 25 LRG (MISCELLANEOUS) ×1 IMPLANT
RTRCTR WOUND ALEXIS 25CM LRG (MISCELLANEOUS) ×1
STRIP CLOSURE SKIN 1/2X4 (GAUZE/BANDAGES/DRESSINGS) IMPLANT
SUT MNCRL 0 VIOLET CTX 36 (SUTURE) ×2 IMPLANT
SUT PLAIN 2 0 XLH (SUTURE) IMPLANT
SUT VIC AB 0 CT1 36 (SUTURE) ×1 IMPLANT
SUT VIC AB 2-0 CT1 27 (SUTURE) ×1
SUT VIC AB 2-0 CT1 TAPERPNT 27 (SUTURE) ×1 IMPLANT
SUT VIC AB 3-0 CT1 27 (SUTURE)
SUT VIC AB 3-0 CT1 TAPERPNT 27 (SUTURE) IMPLANT
SUT VIC AB 4-0 PS2 27 (SUTURE) ×1 IMPLANT
TOWEL OR 17X24 6PK STRL BLUE (TOWEL DISPOSABLE) ×1 IMPLANT
TRAY FOLEY W/BAG SLVR 14FR LF (SET/KITS/TRAYS/PACK) IMPLANT
WATER STERILE IRR 1000ML POUR (IV SOLUTION) ×1 IMPLANT

## 2022-08-29 NOTE — Op Note (Signed)
Cesarean Section Procedure Note   [name]  08/29/2022  Indications: Scheduled Proceedure/Maternal Request; term, h/o ltcs  Pre-operative Diagnosis: Previous Cesarean Section.   Post-operative Diagnosis: Same  Procedure: rltcs Surgeon: Surgeon(s) and Role:    * Amiri Riechers, Vanessa Gallus, MD - Primary    * Law, Johnette Abraham, DO - Assisting   Assistants: Dr Conni Elliot, DO  Anesthesia: spinal   Procedure Details:  The patient was seen in the Holding Room. The risks, benefits, complications, treatment options, and expected outcomes were discussed with the patient. The patient concurred with the proposed plan, giving informed consent. identified as Vanessa Keith and the procedure verified as C-Section Delivery. A Time Out was held and the above information confirmed.  After induction of anesthesia, the patient was draped and prepped in the usual sterile manner, foley was draining urine well.  A pfannenstiel incision was made and carried down through the subcutaneous tissue to the fascia. Fascial incision was made and extended transversely. The fascia was separated from the underlying rectus tissue superiorly and inferiorly. The peritoneum was identified and entered. Peritoneal incision was extended longitudinally. Alexis-O retractor placed. The utero-vesical peritoneal reflection was incised transversely and the bladder flap was bluntly freed from the lower uterine segment. A low transverse uterine incision was made. Delivered from cephalic presentation was a viable female infant with vigorous cry. Apgar scores of 9 at one minute and 9 at five minutes. Delayed cord clamping done at 1 minute and baby handed to NICU team in attendance. Cord ph was not sent. Cord blood was obtained for evaluation. The placenta was removed Intact, spontaneously and appeared normal. The uterine outline, tubes and ovaries appeared normal}. The uterine incision was closed with running locked sutures of . A second  imbricating layer was sutured.   Hemostasis was observed. Alexis retractor removed. Peritoneal closure done with 2-0 Vicryl.  The fascia was then reapproximated with running sutures of 0Vicryl. The subcuticular closure was performed using 2-0plain gut. The skin was closed with 4-0Vicryl.   Instrument, sponge, and needle counts were correct prior the abdominal closure and were correct at the conclusion of the case.    Findings: viable female infant, apgars 9/9; normal appearing uterus, nml tubes/ov bilat   Estimated Blood Loss:   Total IV Fluids:   Urine Output:  200 CC OF clear urine  Specimens: none; placenta to l/d   Complications: no complications  Disposition: PACU - hemodynamically stable.   Maternal Condition: stable   Baby condition / location:  Couplet care / Skin to Skin  Attending Attestation: I performed the procedure.   Signed: Surgeon(s): Amin Fornwalt, Vanessa Gallus, MD Law, Cassandra A, DO

## 2022-08-29 NOTE — H&P (Addendum)
Vanessa Keith is a 32 y.o. G2P1001 at 102w1d gestation presents for repeat c/s. H/o ltcs for arrest of dilation/NRFHT and wants elective c/s. Antepartum course:  h/o PP severe pre-eclampsia - was on baby asa q day, bmi>30 with early gtt wnl; isolated CPC, aga growth   [facility] at [facility] since 10.5 wks.  See complete pre-natal records  History OB History     Gravida  2   Para  1   Term  1   Preterm  0   AB  0   Living  1      SAB  0   IAB  0   Ectopic  0   Multiple  0   Live Births  1          Past Medical History:  Diagnosis Date   Elevated cholesterol 03/06/2017   GERD (gastroesophageal reflux disease)    History of pre-eclampsia in prior pregnancy, currently pregnant    Hypertension 12/2019   Past Surgical History:  Procedure Laterality Date   CESAREAN SECTION N/A 01/12/2020   Procedure: CESAREAN SECTION;  Surgeon: Shea Evans, MD;  Location: [facility] LD ORS;  Service: Obstetrics;  Laterality: N/A;   NO PAST SURGERIES     Family History: family history includes Depression in her mother; Diabetes in her maternal uncle; Hypercholesterolemia in her maternal grandfather and maternal grandmother; Thyroid disease in her father and mother. Social History:  reports that she has never smoked. She has never used smokeless tobacco. She reports current alcohol use. She reports that she does not use drugs.  ROS: See above otherwise negative  Prenatal labs:  ABO, Rh: --/--/O POS (06/05 1610) Antibody: NEG (06/05 9604) Rubella: Immune (11/21 0000) RPR: NON REACTIVE (06/05 1022)  HBsAg: Negative (11/21 0000)  HIV:Non-reactive (11/21 0000)  GBS: Negative/-- (05/14 0000)  1 hr Glucola: Normal Genetic screening: Normal Anatomy US: isolated CPC  Physical Exam:     Blood pressure 128/71, pulse (!) 106, temperature 98.1 F (36.7 C), temperature source Oral, resp. rate 18, height 5\' 5"  (1.651 m), weight 112.9 kg, SpO2 100 %. A&O x 3 HEENT: Normal Lungs:  CTAB CV: RRR Abdominal: Soft, Non-tender, and Gravid Lower Extremities: Non-edematous, Non-tender  Pelvic Exam: deferred  Labs:  CBC:  Lab Results  Component Value Date   WBC 11.2 (H) 08/27/2022   RBC 4.62 08/27/2022   HGB 13.3 08/27/2022   HCT 40.0 08/27/2022   MCV 86.6 08/27/2022   MCH 28.8 08/27/2022   MCHC 33.3 08/27/2022   RDW 14.6 08/27/2022   PLT 240 08/27/2022      Prenatal Transfer Tool  Maternal Diabetes: No Genetic Screening: Normal Maternal Ultrasounds/Referrals: Isolated choroid plexus cyst Fetal Ultrasounds or other Referrals:  None Maternal Substance Abuse:  No Significant Maternal Medications:  Meds include: Other: baby asa Significant Maternal Lab Results: Group B Strep negative Number of Prenatal Visits:greater than 3 verified prenatal visits Other Comments:  None    Assessment/Plan:  32 y.o. G2P1001 at [redacted]w[redacted]d gestation   Term, h/o c/s- pt wants elective c/s, understands risk of bleeding/infection, risk of injury to bowel, bladder/nerves/blood vessels, risk of further surgery, risk of anesthesia, risk of blood clot to leg/lung; pt agrees to proceed, consent signed, to OR Gbs neg Isolated cpc; nipt low risk/female Bmi>30 - nml gtt H/o pp severe pre-e; follow bps closely pp RH pos RI  Efw 43% at 35.5  Vick Frees 08/29/2022, 12:07 PM

## 2022-08-29 NOTE — Anesthesia Postprocedure Evaluation (Signed)
Anesthesia Post Note  Patient: Vanessa Keith  Procedure(s) Performed: Repeat CESAREAN SECTION     Patient location during evaluation: Mother Baby Anesthesia Type: Spinal Level of consciousness: oriented and awake and alert Pain management: pain level controlled Vital Signs Assessment: post-procedure vital signs reviewed and stable Respiratory status: spontaneous breathing and respiratory function stable Cardiovascular status: blood pressure returned to baseline and stable Postop Assessment: no headache, no backache, no apparent nausea or vomiting and able to ambulate Anesthetic complications: no   No notable events documented.  Last Vitals:  Vitals:   08/29/22 1500 08/29/22 1513  BP: 98/65 (!) 91/56  Pulse: 61 79  Resp: 14 15  Temp:  36.8 C  SpO2: 96% 98%    Last Pain:  Vitals:   08/29/22 1515  TempSrc:   PainSc: 0-No pain   Pain Goal:                Epidural/Spinal Function Cutaneous sensation: Able to Wiggle Toes (08/29/22 1515), Patient able to flex knees: Yes (08/29/22 1515), Patient able to lift hips off bed: Yes (08/29/22 1515), Back pain beyond tenderness at insertion site: No (08/29/22 1515), Progressively worsening motor and/or sensory loss: No (08/29/22 1515), Bowel and/or bladder incontinence post epidural: No (08/29/22 1515)  Trevor Iha

## 2022-08-29 NOTE — Transfer of Care (Signed)
Immediate Anesthesia Transfer of Care Note  Patient: Vanessa Keith  Procedure(s) Performed: Repeat CESAREAN SECTION  Patient Location: PACU  Anesthesia Type:Spinal  Level of Consciousness: awake, alert , and oriented  Airway & Oxygen Therapy: Patient Spontanous Breathing  Post-op Assessment: Report given to RN and Post -op Vital signs reviewed and stable  Post vital signs: Reviewed and stable  Last Vitals:  Vitals Value Taken Time  BP 112/58 08/29/22 1406  Temp 36.6 C 08/29/22 1406  Pulse 67 08/29/22 1412  Resp 14 08/29/22 1412  SpO2 97 % 08/29/22 1412  Vitals shown include unvalidated device data.  Last Pain:  Vitals:   08/29/22 1406  TempSrc: Oral  PainSc:          Complications: No notable events documented.

## 2022-08-30 ENCOUNTER — Other Ambulatory Visit: Payer: Self-pay

## 2022-08-30 ENCOUNTER — Encounter (HOSPITAL_COMMUNITY): Payer: Self-pay | Admitting: Obstetrics and Gynecology

## 2022-08-30 LAB — CBC
HCT: 33.7 % — ABNORMAL LOW (ref 36.0–46.0)
Hemoglobin: 10.9 g/dL — ABNORMAL LOW (ref 12.0–15.0)
MCH: 28.6 pg (ref 26.0–34.0)
MCHC: 32.3 g/dL (ref 30.0–36.0)
MCV: 88.5 fL (ref 80.0–100.0)
Platelets: 235 10*3/uL (ref 150–400)
RBC: 3.81 MIL/uL — ABNORMAL LOW (ref 3.87–5.11)
RDW: 14.5 % (ref 11.5–15.5)
WBC: 16.5 10*3/uL — ABNORMAL HIGH (ref 4.0–10.5)
nRBC: 0 % (ref 0.0–0.2)

## 2022-08-30 NOTE — Progress Notes (Addendum)
No c/o; pain controlled, tol po, ambulating, no flatus though feels like needs to Breastfeeding Foley out this am, hasn't voided yet  Patient Vitals for the past 24 hrs:  BP Temp Temp src Pulse Resp SpO2  08/30/22 0632 (!) 100/54 98.5 F (36.9 C) Oral 60 16 --  08/30/22 0340 96/64 98.7 F (37.1 C) Oral 69 16 --  08/30/22 0300 -- -- -- -- 16 99 %  08/29/22 2311 114/69 98 F (36.7 C) Oral 70 16 --  08/29/22 2127 -- 98.4 F (36.9 C) Oral -- -- --  08/29/22 1847 105/61 (!) 97.5 F (36.4 C) Oral 65 15 98 %  08/29/22 1729 120/75 97.6 F (36.4 C) Oral 63 15 97 %  08/29/22 1623 (!) 89/58 98 F (36.7 C) Oral 72 15 98 %  08/29/22 1513 (!) 91/56 98.2 F (36.8 C) Axillary 79 15 98 %  08/29/22 1500 98/65 -- -- 61 14 96 %  08/29/22 1445 (!) 103/54 -- -- 67 17 97 %  08/29/22 1430 113/62 -- -- 70 12 98 %  08/29/22 1421 -- -- -- 70 14 98 %  08/29/22 1415 (!) 114/55 -- -- 67 (!) 23 96 %  08/29/22 1406 (!) 112/58 97.8 F (36.6 C) Oral 72 15 96 %   Intake/Output Summary (Last 24 hours) at 08/30/2022 1222 Last data filed at 08/30/2022 0930 Gross per 24 hour  Intake 1100 ml  Output 2644 ml  Net -1544 ml     A&ox3 Rrr Ctab Abd: soft,nt,nd, fundus firm and below umb; dressing 2 small places old blood, no active bleeding LE: tr edema, nt bilat     Latest Ref Rng & Units 08/30/2022    5:40 AM 08/27/2022   10:22 AM 06/27/2021    9:04 AM  CBC  WBC 4.0 - 10.5 K/uL 16.5  11.2  7.2   Hemoglobin 12.0 - 15.0 g/dL 16.1  09.6  04.5   Hematocrit 36.0 - 46.0 % 33.7  40.0  42.1   Platelets 150 - 400 K/uL 235  240  300.0    A/P: pod1 s/p ltcs Doing well, contin care Mild,acute anemia d/t blood loss - asymptomatic, plan oral iron qd; encourage hydration Breastfeeding

## 2022-08-30 NOTE — Lactation Note (Signed)
This note was copied from a baby's chart. Lactation Consultation Note  Patient Name: Vanessa Keith [id number] Date: 08/30/2022 Age:31 hours   LC attempted to visit with the birth parent but she was asleep. Lactation will follow-up later.    Zunairah Devers P Nevan Creighton 08/30/2022, 8:23 AM

## 2022-08-30 NOTE — Lactation Note (Signed)
This note was copied from a baby's chart. Lactation Consultation Note  Patient Name: Vanessa Keith ZOXWR'U Date: 08/30/2022 Age:31 hours Reason for consult: Initial assessment;Term;Infant weight loss;Breastfeeding assistance (2.45% WL)  The infant was at 12 hours old.  The birth parent stated that things are going well with breastfeeding and denies any pain with latching.  The birth parent breast fed her older child who is now 5 years old for 10 months.  LC reviewed the outpatient services brochure.  The birth parent had no questions or concerns.   Infant Feeding Plan:  Breastfeed 8+ times in 24 hours according to feeding cues.  Call RN/LC for assistance with breastfeeding. Prioritize maternal rest, nutrition, and hydration.    Maternal Data Has patient been taught Hand Expression?: Yes Does the patient have breastfeeding experience prior to this delivery?: Yes How long did the patient breastfeed?: 10 months  Feeding Mother's Current Feeding Choice: Breast Milk  Interventions Interventions: LC Services brochure  Discharge Pump: DEBP;Manual;Hands Free;Personal  Consult Status Consult Status: Follow-up Date: 08/31/22 Follow-up type: In-patient   Vanessa Keith 08/30/2022, 10:42 AM

## 2022-08-31 MED ORDER — HYDROCODONE-ACETAMINOPHEN 5-325 MG PO TABS: 1.0000 | ORAL_TABLET | ORAL | 0 refills | Status: DC | PRN

## 2022-08-31 MED ORDER — IBUPROFEN 600 MG PO TABS: 600.0000 mg | ORAL_TABLET | Freq: Four times a day (QID) | ORAL | 0 refills | Status: DC

## 2022-08-31 NOTE — Progress Notes (Signed)
No c/o; tol po, ambulating, +flatus and also bm; voids w/o difficulty Nml lochia breastfeeding   Patient Vitals for the past 24 hrs:  BP Temp Temp src Pulse Resp SpO2  08/31/22 0534 (!) 117/58 97.8 F (36.6 C) Oral 66 16 99 %  08/30/22 2034 126/65 98.3 F (36.8 C) Oral 61 17 98 %  08/30/22 1523 116/66 98.2 F (36.8 C) Oral 62 18 96 %   A&ox3 Rrr Ctab Abd: soft,nt,nd, +bs; fundus firm and below umb; dressing: old blood/dry, intact LE: no edema, nt bilat     Latest Ref Rng & Units 08/30/2022    5:40 AM 08/27/2022   10:22 AM 06/27/2021    9:04 AM  CBC  WBC 4.0 - 10.5 K/uL 16.5  11.2  7.2   Hemoglobin 12.0 - 15.0 g/dL 16.1  09.6  04.5   Hematocrit 36.0 - 46.0 % 33.7  40.0  42.1   Platelets 150 - 400 K/uL 235  240  300.0    A/P: pod2 s/p svd Doing well, d/c home Acute anemia d/t blood loss - asymptomatic, plan oral iron q day RH pos RI Obesity - encourage good wound care, ambulation

## 2022-08-31 NOTE — Discharge Instructions (Signed)
WHAT TO LOOK OUT FOR: Fever of 100.4 or above Mastitis: feels like flu and breasts hurt Infection: increased pain, swelling or redness Blood clots golf ball size or larger Postpartum depression   Congratulations on your newest addition! 

## 2022-08-31 NOTE — Discharge Summary (Signed)
Postpartum Discharge Summary  Date of Service updated     Patient Name: Vanessa Keith DOB: 1990/09/11 MRN: [id number]  Date of admission: 08/29/2022 Delivery date:08/29/2022  Delivering provider: Rhoderick Moody E  Date of discharge: 08/31/2022  Admitting diagnosis: Previous cesarean section [Z98.891] Intrauterine pregnancy: [redacted]w[redacted]d     Secondary diagnosis:  Principal Problem:   Previous cesarean section  Additional problems: anemia    Discharge diagnosis: Term Pregnancy Delivered                                              Post partum procedures: none Complications: None  Hospital course: Sceduled C/S   32 y.o. yo [name] at [redacted]w[redacted]d was admitted to the hospital 08/29/2022 for scheduled cesarean section with the following indication:Elective Repeat.Delivery details are as follows:  Membrane Rupture Time/Date: 1:02 PM ,08/29/2022   Delivery Method:C-Section, Low Transverse  Details of operation can be found in separate operative note.  Patient had a postpartum course complicated by anemia.  She is ambulating, tolerating a regular diet, passing flatus, and urinating well. Patient is discharged home in stable condition on  08/31/22        Newborn Data: Birth date:08/29/2022  Birth time:1:02 PM  Gender:Female  Living status:Living  Apgars:9 ,9  Weight:3060 g     Physical exam  Vitals:   08/30/22 0632 08/30/22 1523 08/30/22 2034 08/31/22 0534  BP: (!) 100/54 116/66 126/65 (!) 117/58  Pulse: 60 62 61 66  Resp: 16 18 17 16   Temp: 98.5 F (36.9 C) 98.2 F (36.8 C) 98.3 F (36.8 C) 97.8 F (36.6 C)  TempSrc: Oral Oral Oral Oral  SpO2:  96% 98% 99%  Weight:      Height:       General: alert and cooperative Lochia: appropriate Uterine Fundus: firm Incision: Dressing is clean, dry, and intact DVT Evaluation: No evidence of DVT seen on physical exam. Labs: Lab Results  Component Value Date   WBC 16.5 (H) 08/30/2022   HGB 10.9 (L) 08/30/2022   HCT 33.7 (L) 08/30/2022   MCV  88.5 08/30/2022   PLT 235 08/30/2022      Latest Ref Rng & Units 06/27/2021    9:04 AM  CMP  Glucose 70 - 99 mg/dL 91   BUN 6 - 23 mg/dL 18   Creatinine 7.84 - 1.20 mg/dL 6.96   Sodium 295 - 284 mEq/L 137   Potassium 3.5 - 5.1 mEq/L 4.2   Chloride 96 - 112 mEq/L 103   CO2 19 - 32 mEq/L 27   Calcium 8.4 - 10.5 mg/dL 9.6   Total Protein 6.0 - 8.3 g/dL 7.4   Total Bilirubin 0.2 - 1.2 mg/dL 0.6   Alkaline Phos 39 - 117 U/L 53   AST 0 - 37 U/L 26   ALT 0 - 35 U/L 29    Edinburgh Score:    08/29/2022    6:47 PM  Edinburgh Postnatal Depression Scale Screening Tool  I have been able to laugh and see the funny side of things. 0  I have looked forward with enjoyment to things. 0  I have blamed myself unnecessarily when things went wrong. 1  I have been anxious or worried for no good reason. 2  I have felt scared or panicky for no good reason. 0  Things have been getting on top of me.  0  I have been so unhappy that I have had difficulty sleeping. 0  I have felt sad or miserable. 1  I have been so unhappy that I have been crying. 0  The thought of harming myself has occurred to me. 0  Edinburgh Postnatal Depression Scale Total 4      After visit meds:  Allergies as of 08/31/2022   No Known Allergies      Medication List     STOP taking these medications    famotidine 20 MG tablet Commonly known as: PEPCID       TAKE these medications    HYDROcodone-acetaminophen 5-325 MG tablet Commonly known as: NORCO/VICODIN Take 1-2 tablets by mouth every 4 (four) hours as needed for moderate pain.   ibuprofen 600 MG tablet Commonly known as: ADVIL Take 1 tablet (600 mg total) by mouth every 6 (six) hours.   prenatal multivitamin Tabs tablet Take 1 tablet by mouth daily.         Discharge home in stable condition Infant Feeding: Breast Infant Disposition:home with mother Discharge instruction: per After Visit Summary and Postpartum booklet. Activity: Advance as  tolerated. Pelvic rest for 6 weeks.  Diet: routine diet Anticipated Birth Control: Unsure Postpartum Appointment:6 weeks Additional Postpartum F/U:  none Future Appointments:No future appointments. Follow up Visit:  Follow-up Information     Coralee Edberg, Candace Gallus, MD Follow up.   Specialty: Obstetrics and Gynecology Contact information: [address] [place] [address] [phone]                     08/31/2022 Vick Frees, MD

## 2022-08-31 NOTE — Lactation Note (Signed)
This note was copied from a baby's chart. Lactation Consultation Note  Patient Name: Girl Shanise Balch ZOXWR'U Date: 08/31/2022 Age:31 hours Reason for consult: Follow-up assessment;Term;Infant weight loss;Nipple pain/trauma Baby latched as LC entered the room. See assessment of the baby already latched below.  LC reviewed steps for latching since mom had mentioned when the baby releases the nipple is slightly slanted.  LC reviewed prevention and engorgement and tx if needed.     Maternal Data    Feeding Mother's Current Feeding Choice: Breast Milk  LATCH Score Latch:  (latched on the left breast with depth , had mom flip the upper lip to increase the flange .)  Audible Swallowing:  (increased swallows noted)  Type of Nipple:  (nipple slight slanted when the baby released)  Comfort (Breast/Nipple):  (per mom feels like the milk is coming in)  Hold (Positioning):  (per mom comfortable)    Interventions Interventions: Breast feeding basics reviewed;Education;LC Services brochure;Reverse pressure  Discharge Discharge Education: Engorgement and breast care Pump: Manual;DEBP;Hands Free;Personal WIC Program: No  Consult Status Consult Status: Complete Date: 08/31/22    Kathrin Greathouse 08/31/2022, 12:21 PM

## 2022-09-17 ENCOUNTER — Telehealth (HOSPITAL_COMMUNITY): Payer: Self-pay | Admitting: *Deleted

## 2022-09-17 NOTE — Telephone Encounter (Signed)
Attempted hospital discharge follow-up call. Left message for patient to return RN call with any questions or concerns. Deforest Hoyles, RN, 09/17/22, [phone]

## 2022-10-10 DIAGNOSIS — Z1331 Encounter for screening for depression: Secondary | ICD-10-CM | POA: Diagnosis not present

## 2023-09-09 ENCOUNTER — Encounter: Admitting: Physician Assistant

## 2023-11-12 ENCOUNTER — Encounter: Admitting: Physician Assistant

## 2023-11-18 ENCOUNTER — Encounter: Payer: Self-pay | Admitting: Physician Assistant

## 2023-11-18 ENCOUNTER — Ambulatory Visit: Admitting: Physician Assistant

## 2023-11-18 VITALS — BP 120/80 | HR 77 | Temp 98.4°F | Ht 65.0 in | Wt 228.5 lb

## 2023-11-18 DIAGNOSIS — E66812 Obesity, class 2: Secondary | ICD-10-CM

## 2023-11-18 MED ORDER — ZEPBOUND 2.5 MG/0.5ML ~~LOC~~ SOAJ: 2.5000 mg | SUBCUTANEOUS | 1 refills | Status: DC

## 2023-11-18 NOTE — Progress Notes (Signed)
 Vanessa Keith is a 33 y.o. female here for a follow up of a pre-existing problem.  History of Present Illness:   Chief Complaint  Patient presents with   Weight Management Screening    Pt is wanting to discuss weight loss medication and nutrition.    Discussed the use of AI scribe software for clinical note transcription with the patient, who gave verbal consent to proceed.  History of Present Illness Vanessa Keith is a 33 year old female who presents with concerns about weight management and potential use of GLP-1 medications.  She has struggled with weight issues since childhood and has tried various weight loss methods, including a high protein, low carb diet, and phentermine  in 2019-2020 without significant effectiveness. She lost 10-12 pounds before the summer by consuming 1800-1900 calories daily without significant exercise. Maintaining discipline is challenging due to her full-time job and responsibilities as a mother of two young children.  She is interested in exploring GLP-1 medications for weight loss, having seen positive results in others. She is currently not on any weight loss medications and is considering options that may be covered by insurance.  She has a family history of diabetes on her maternal side, with two uncles affected, but no personal history of gestational diabetes. She wants to be healthy for her children and feels uncomfortable in her current body.  She is open to lifestyle changes, including diet and exercise, and has previously found structured exercise programs like [facility] helpful. She is considering joining a local workout group for accountability and support. She is currently eating well balanced meals with overall calorie deficit for weight loss. She is taking regular walks with her husband in efforts to lose weight -- going about 30-45 minutes at least 3 days per week.     Past Medical History:  Diagnosis Date   Elevated  cholesterol 03/06/2017   GERD (gastroesophageal reflux disease)    History of pre-eclampsia in prior pregnancy, currently pregnant    Hypertension 12/2019     Social History   Tobacco Use   Smoking status: Never   Smokeless tobacco: Never  Vaping Use   Vaping status: Never Used  Substance Use Topics   Alcohol use: Yes    Comment: socially barely any   Drug use: Never    Past Surgical History:  Procedure Laterality Date   CESAREAN SECTION N/A 01/12/2020   Procedure: CESAREAN SECTION;  Surgeon: Barbette Knock, MD;  Location: [facility];  Service: Obstetrics;  Laterality: N/A;   CESAREAN SECTION N/A 08/29/2022   Procedure: Repeat CESAREAN SECTION;  Surgeon: Linnell Devere BRAVO, MD;  Location: [facility] ORS;  Service: Obstetrics;  Laterality: N/A;  EDD: 09/04/22   NO PAST SURGERIES      Family History  Problem Relation Age of Onset   Thyroid  disease Mother    Depression Mother    Thyroid  disease Father    Diabetes Maternal Uncle    Hypercholesterolemia Maternal Grandmother    Hypercholesterolemia Maternal Grandfather    Breast cancer Neg Hx    Colon cancer Neg Hx     No Known Allergies  Current Medications:  No current outpatient medications on file.   Review of Systems:   Negative unless otherwise specified per HPI.  Vitals:   Vitals:   11/18/23 1444  BP: 120/80  Pulse: 77  Temp: 98.4 F (36.9 C)  TempSrc: Temporal  SpO2: 99%  Weight: 228 lb 8 oz (103.6 kg)  Height: 5' 5 (  1.651 m)     Body mass index is 38.02 kg/m.  Physical Exam:   Physical Exam Vitals and nursing note reviewed.  Constitutional:      General: She is not in acute distress.    Appearance: She is well-developed. She is not ill-appearing or toxic-appearing.  Cardiovascular:     Rate and Rhythm: Normal rate and regular rhythm.     Pulses: Normal pulses.     Heart sounds: Normal heart sounds, S1 normal and S2 normal.  Pulmonary:     Effort: Pulmonary effort is normal.     Breath sounds:  Normal breath sounds.  Skin:    General: Skin is warm and dry.  Neurological:     Mental Status: She is alert.     GCS: GCS eye subscore is 4. GCS verbal subscore is 5. GCS motor subscore is 6.  Psychiatric:        Speech: Speech normal.        Behavior: Behavior normal. Behavior is cooperative.     Assessment and Plan:   Assessment and Plan Assessment & Plan Obesity Obesity with interest in GLP-1 receptor agonists for weight management. Discussed benefits, side effects, and insurance considerations. Emphasized diet and exercise for sustainable weight loss. Considered Contrave as an alternative. - Submit insurance request for Zepbound . - If insurance does not cover, consider self-pay options for Zepbound  or Wegovy. - Encourage strength training to maintain muscle mass. - Provide information on [employer] for exercise support. - Discuss dietary habits and portion control with emphasis on balanced meals.     Lucie Buttner, PA-C

## 2023-11-20 ENCOUNTER — Telehealth: Payer: Self-pay

## 2023-11-20 ENCOUNTER — Other Ambulatory Visit (HOSPITAL_COMMUNITY): Payer: Self-pay

## 2023-11-20 NOTE — Telephone Encounter (Signed)
 Pharmacy Patient Advocate Encounter   Received notification from Onbase that prior authorization for Zepbound  2.5MG /0.5ML auto-injectors is required/requested.   Insurance verification completed.   The patient is insured through [facility] .   Per test claim:

## 2023-11-25 ENCOUNTER — Other Ambulatory Visit: Payer: Self-pay | Admitting: Physician Assistant

## 2023-11-25 MED ORDER — PHENTERMINE HCL 37.5 MG PO TABS: ORAL_TABLET | ORAL | 0 refills | Status: DC

## 2023-11-25 NOTE — Telephone Encounter (Signed)
 Vanessa Keith, pt would like script for Phentermine  since her insurance will not cover weight loss medications.

## 2024-02-24 ENCOUNTER — Ambulatory Visit: Payer: Self-pay | Admitting: Physician Assistant

## 2024-02-24 ENCOUNTER — Encounter: Payer: Self-pay | Admitting: Physician Assistant

## 2024-02-24 ENCOUNTER — Ambulatory Visit: Admitting: Physician Assistant

## 2024-02-24 VITALS — BP 110/72 | HR 98 | Temp 98.5°F | Ht 65.0 in | Wt 224.4 lb

## 2024-02-24 DIAGNOSIS — Z0000 Encounter for general adult medical examination without abnormal findings: Secondary | ICD-10-CM

## 2024-02-24 DIAGNOSIS — E66812 Obesity, class 2: Secondary | ICD-10-CM

## 2024-02-24 DIAGNOSIS — N920 Excessive and frequent menstruation with regular cycle: Secondary | ICD-10-CM

## 2024-02-24 DIAGNOSIS — Z8349 Family history of other endocrine, nutritional and metabolic diseases: Secondary | ICD-10-CM

## 2024-02-24 DIAGNOSIS — Z6837 Body mass index (BMI) 37.0-37.9, adult: Secondary | ICD-10-CM | POA: Diagnosis not present

## 2024-02-24 DIAGNOSIS — E7800 Pure hypercholesterolemia, unspecified: Secondary | ICD-10-CM

## 2024-02-24 DIAGNOSIS — L989 Disorder of the skin and subcutaneous tissue, unspecified: Secondary | ICD-10-CM

## 2024-02-24 LAB — IBC + FERRITIN
Ferritin: 37.4 ng/mL (ref 10.0–291.0)
Iron: 157 ug/dL — ABNORMAL HIGH (ref 42–145)
Saturation Ratios: 43.8 % (ref 20.0–50.0)
TIBC: 358.4 ug/dL (ref 250.0–450.0)
Transferrin: 256 mg/dL (ref 212.0–360.0)

## 2024-02-24 LAB — CBC WITH DIFFERENTIAL/PLATELET
Basophils Absolute: 0 K/uL (ref 0.0–0.1)
Basophils Relative: 0.6 % (ref 0.0–3.0)
Eosinophils Absolute: 0.2 K/uL (ref 0.0–0.7)
Eosinophils Relative: 2.3 % (ref 0.0–5.0)
HCT: 43.1 % (ref 36.0–46.0)
Hemoglobin: 14.4 g/dL (ref 12.0–15.0)
Lymphocytes Relative: 33.3 % (ref 12.0–46.0)
Lymphs Abs: 2.5 K/uL (ref 0.7–4.0)
MCHC: 33.4 g/dL (ref 30.0–36.0)
MCV: 85.7 fl (ref 78.0–100.0)
Monocytes Absolute: 0.5 K/uL (ref 0.1–1.0)
Monocytes Relative: 6.5 % (ref 3.0–12.0)
Neutro Abs: 4.3 K/uL (ref 1.4–7.7)
Neutrophils Relative %: 57.3 % (ref 43.0–77.0)
Platelets: 234 K/uL (ref 150.0–400.0)
RBC: 5.03 Mil/uL (ref 3.87–5.11)
RDW: 14 % (ref 11.5–15.5)
WBC: 7.5 K/uL (ref 4.0–10.5)

## 2024-02-24 LAB — LIPID PANEL
Cholesterol: 228 mg/dL — ABNORMAL HIGH (ref 0–200)
HDL: 66.5 mg/dL (ref >39.00)
LDL Cholesterol: 149 mg/dL — ABNORMAL HIGH (ref 0–99)
NonHDL: 161.48
Total CHOL/HDL Ratio: 3
Triglycerides: 60 mg/dL (ref 0.0–149.0)
VLDL: 12 mg/dL (ref 0.0–40.0)

## 2024-02-24 LAB — COMPREHENSIVE METABOLIC PANEL WITH GFR
ALT: 32 U/L (ref 0–35)
AST: 24 U/L (ref 0–37)
Albumin: 4.4 g/dL (ref 3.5–5.2)
Alkaline Phosphatase: 49 U/L (ref 39–117)
BUN: 16 mg/dL (ref 6–23)
CO2: 28 meq/L (ref 19–32)
Calcium: 9.5 mg/dL (ref 8.4–10.5)
Chloride: 104 meq/L (ref 96–112)
Creatinine, Ser: 0.78 mg/dL (ref 0.40–1.20)
GFR: 100.26 mL/min (ref >60.00)
Glucose, Bld: 85 mg/dL (ref 70–99)
Potassium: 4.1 meq/L (ref 3.5–5.1)
Sodium: 140 meq/L (ref 135–145)
Total Bilirubin: 0.7 mg/dL (ref 0.2–1.2)
Total Protein: 7.4 g/dL (ref 6.0–8.3)

## 2024-02-24 LAB — TSH: TSH: 3.64 u[IU]/mL (ref 0.35–5.50)

## 2024-02-24 NOTE — Progress Notes (Signed)
 Subjective:    Shatha Hooser is a 33 y.o. female and is here for a comprehensive physical exam.  HPI  Health Maintenance Due  Topic Date Due   Hepatitis C Screening  Never done   Cervical Cancer Screening (HPV/Pap Cotest)  05/19/2021    Discussed the use of AI scribe software for clinical note transcription with the patient, who gave verbal consent to proceed.  History of Present Illness   Jilliane Kazanjian is a 33 year old female who presents for a follow-up regarding weight management and thyroid  function.  She has tried multiple weight management options. Insurance did not cover Zepbound . Phentermine  caused excessive appetite suppression and was stopped. She is interested in GLP-1 options, including Wegovy, Zepbound , and upcoming oral alternatives, and is considering future affordability. She is not currently using structured exercise after finding [employer] uncomfortable.  Both parents have hypothyroidism treated with levothyroxine. She has not had recent thyroid  testing and would like evaluation based on family history.  She rarely drinks alcohol, does not smoke, and has vitamin D  deficiency in the past. Her diet focuses on portion control and reduced sugar, with regular meals of meat, rice, and vegetables. She sometimes skips breakfast.  She has variable sleep and stays up late for personal downtime after her children go to bed. She has parental anxiety related to her children's safety at night without panic attacks and feels supported discussing this with her husband and colleagues.  Her menses are regular but heavy with clots on the second day. She does not take iron supplements.  She has a skin spot she wants evaluated. She denies unexplained headaches, swelling, or joint pain. She occasionally has heartburn, especially with black coffee on an empty stomach.       Health Maintenance: Immunizations -- declined flu shot Colonoscopy -- n/a Mammogram -- n/a PAP --  UpToDate -- requesting records Bone Density -- n/a Diet -- overall well balanced, less sugary foods Exercise -- [employer]  Sleep habits -- overall no major concerns Mood -- no major concerns  UTD with dentist? - no UTD with eye doctor? - no  Weight history: Wt Readings from Last 10 Encounters:  02/24/24 224 lb 6.4 oz (101.8 kg)  11/18/23 228 lb 8 oz (103.6 kg)  08/29/22 248 lb 14.4 oz (112.9 kg)  08/21/22 245 lb (111.1 kg)  06/27/21 227 lb 3.2 oz (103.1 kg)  06/22/20 219 lb 12.8 oz (99.7 kg)  01/23/20 244 lb (110.7 kg)  01/11/20 253 lb 4.8 oz (114.9 kg)  11/25/18 204 lb (92.5 kg)  03/12/18 198 lb (89.8 kg)   Body mass index is 37.34 kg/m. Patient's last menstrual period was 01/28/2024 (approximate).  Alcohol use:  reports current alcohol use.  Tobacco use:  Tobacco Use: Low Risk  (02/24/2024)   Patient History    Smoking Tobacco Use: Never    Smokeless Tobacco Use: Never    Passive Exposure: Not on file   Eligible for lung cancer screening? no     11/18/2023    2:43 PM  Depression screen PHQ 2/9  Decreased Interest 0  Down, Depressed, Hopeless 0  PHQ - 2 Score 0     Other providers/specialists: Patient Care Team: Job Lukes, [place] as PCP - General (Physician Assistant)    PMHx, SurgHx, SocialHx, Medications, and Allergies were reviewed in the Visit Navigator and updated as appropriate.   Past Medical History:  Diagnosis Date   Elevated cholesterol 03/06/2017   GERD (gastroesophageal reflux disease)  History of pre-eclampsia in prior pregnancy, currently pregnant    Hypertension 12/2019     Past Surgical History:  Procedure Laterality Date   CESAREAN SECTION N/A 01/12/2020   Procedure: CESAREAN SECTION;  Surgeon: Barbette Knock, MD;  Location: [facility];  Service: Obstetrics;  Laterality: N/A;   CESAREAN SECTION N/A 08/29/2022   Procedure: Repeat CESAREAN SECTION;  Surgeon: Linnell Devere BRAVO, MD;  Location: [facility];  Service: Obstetrics;   Laterality: N/A;  EDD: 09/04/22   NO PAST SURGERIES       Family History  Problem Relation Age of Onset   Thyroid  disease Mother    Depression Mother    Thyroid  disease Father    Diabetes Maternal Uncle    Hypercholesterolemia Maternal Grandmother    Hypercholesterolemia Maternal Grandfather    Breast cancer Neg Hx    Colon cancer Neg Hx     Social History   Tobacco Use   Smoking status: Never   Smokeless tobacco: Never  Vaping Use   Vaping status: Never Used  Substance Use Topics   Alcohol use: Yes    Comment: socially barely any   Drug use: Never    Review of Systems:   Review of Systems  Constitutional:  Negative for chills, fever, malaise/fatigue and weight loss.  HENT:  Negative for hearing loss, sinus pain and sore throat.   Respiratory:  Negative for cough and hemoptysis.   Cardiovascular:  Negative for chest pain, palpitations, leg swelling and PND.  Gastrointestinal:  Negative for abdominal pain, constipation, diarrhea, heartburn, nausea and vomiting.  Genitourinary:  Negative for dysuria, frequency and urgency.  Musculoskeletal:  Negative for back pain, myalgias and neck pain.  Skin:  Negative for itching and rash.  Neurological:  Negative for dizziness, tingling, seizures and headaches.  Endo/Heme/Allergies:  Negative for polydipsia.  Psychiatric/Behavioral:  Negative for depression. The patient is not nervous/anxious.     Objective:   BP 110/72   Pulse 98   Temp 98.5 F (36.9 C) (Temporal)   Ht 5' 5 (1.651 m)   Wt 224 lb 6.4 oz (101.8 kg)   LMP 01/28/2024 (Approximate)   SpO2 98%   BMI 37.34 kg/m  Body mass index is 37.34 kg/m.   General Appearance:    Alert, cooperative, no distress, appears stated age  Head:    Normocephalic, without obvious abnormality, atraumatic  Eyes:    PERRL, conjunctiva/corneas clear, EOM's intact, fundi    benign, both eyes  Ears:    Normal TM's and external ear canals, both ears  Nose:   Nares normal, septum  midline, mucosa normal, no drainage    or sinus tenderness  Throat:   Lips, mucosa, and tongue normal; teeth and gums normal  Neck:   Supple, symmetrical, trachea midline, no adenopathy;    thyroid :  no enlargement/tenderness/nodules; no carotid   bruit or JVD  Back:     Symmetric, no curvature, ROM normal, no CVA tenderness  Lungs:     Clear to auscultation bilaterally, respirations unlabored  Chest Wall:    No tenderness or deformity   Heart:    Regular rate and rhythm, S1 and S2 normal, no murmur, rub or gallop  Breast Exam:    Deferred  Abdomen:     Soft, non-tender, bowel sounds active all four quadrants,    no masses, no organomegaly  Genitalia:    Deferred   Extremities:   Extremities normal, atraumatic, no cyanosis or edema  Pulses:   2+ and  symmetric all extremities  Skin:   Skin color, texture, turgor normal, no rashes or lesions  Lymph nodes:   Cervical, supraclavicular, and axillary nodes normal  Neurologic:   CNII-XII intact, normal strength, sensation and reflexes    throughout    Assessment/Plan:   Assessment and Plan    Adult Wellness Visit Routine wellness visit. Blood pressure controlled. No recent Pap smear. Rare alcohol use, non-smoker. Good mental health with parental anxiety. Regular menstrual cycles with heavy bleeding. Skin lesion noted. No recent dental or eye exams. - Ordered blood work for thyroid , cholesterol, liver, and kidney function. - Requested Pap smear history from [facility]. - Referred to dermatology for skin lesion evaluation. - Encouraged scheduling dental and eye exams.  Obesity, class 2 Previous medications not tolerated. Interest in [place] and oral options pending financial feasibility. - Monitor availability of oral weight management options in January. - Consider Wegovy if financially feasible.  Pure hypercholesterolemia Cholesterol levels to be checked. - Ordered blood work to check cholesterol levels.  Heavy menstrual bleeding  with regular cycles Heavy bleeding on second day with clotting. No current iron supplementation.  Skin lesion, unspecified Skin lesion requires evaluation. - Referred to dermatology for evaluation of skin lesion.      Family history of thyroid  issues Update TSH      Lucie Buttner, PA-C [facility]

## 2024-03-05 DIAGNOSIS — L600 Ingrowing nail: Secondary | ICD-10-CM | POA: Diagnosis not present
